# Patient Record
Sex: Male | Born: 1970 | Race: White | Hispanic: No | Marital: Married | Smoking: Never smoker
Health system: Southern US, Community
[De-identification: ages and names within clinical notes are randomized; demographics above are authoritative.]

## PROBLEM LIST (undated history)

## (undated) DIAGNOSIS — E785 Hyperlipidemia, unspecified: Secondary | ICD-10-CM

## (undated) DIAGNOSIS — Z0000 Encounter for general adult medical examination without abnormal findings: Principal | ICD-10-CM

## (undated) DIAGNOSIS — K8020 Calculus of gallbladder without cholecystitis without obstruction: Secondary | ICD-10-CM

## (undated) DIAGNOSIS — N200 Calculus of kidney: Secondary | ICD-10-CM

## (undated) DIAGNOSIS — T7840XA Allergy, unspecified, initial encounter: Secondary | ICD-10-CM

## (undated) DIAGNOSIS — K219 Gastro-esophageal reflux disease without esophagitis: Secondary | ICD-10-CM

## (undated) DIAGNOSIS — E669 Obesity, unspecified: Secondary | ICD-10-CM

## (undated) DIAGNOSIS — M109 Gout, unspecified: Secondary | ICD-10-CM

## (undated) DIAGNOSIS — K429 Umbilical hernia without obstruction or gangrene: Secondary | ICD-10-CM

## (undated) HISTORY — DX: Allergy, unspecified, initial encounter: T78.40XA

## (undated) HISTORY — DX: Hyperlipidemia, unspecified: E78.5

## (undated) HISTORY — DX: Calculus of gallbladder without cholecystitis without obstruction: K80.20

## (undated) HISTORY — DX: Gout, unspecified: M10.9

## (undated) HISTORY — DX: Gastro-esophageal reflux disease without esophagitis: K21.9

## (undated) HISTORY — DX: Umbilical hernia without obstruction or gangrene: K42.9

## (undated) HISTORY — PX: WISDOM TOOTH EXTRACTION: SHX21

## (undated) HISTORY — DX: Obesity, unspecified: E66.9

## (undated) HISTORY — DX: Calculus of kidney: N20.0

## (undated) HISTORY — DX: Encounter for general adult medical examination without abnormal findings: Z00.00

---
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
## 2012-02-01 ENCOUNTER — Ambulatory Visit (INDEPENDENT_AMBULATORY_CARE_PROVIDER_SITE_OTHER): Payer: Managed Care, Other (non HMO) | Admitting: Internal Medicine

## 2012-02-01 ENCOUNTER — Encounter: Payer: Self-pay | Admitting: Internal Medicine

## 2012-02-01 VITALS — BP 88/70 | HR 66 | Temp 98.4°F | Resp 16 | Ht 70.25 in | Wt 216.2 lb

## 2012-02-01 DIAGNOSIS — T7840XA Allergy, unspecified, initial encounter: Secondary | ICD-10-CM

## 2012-02-01 DIAGNOSIS — J310 Chronic rhinitis: Secondary | ICD-10-CM

## 2012-02-01 DIAGNOSIS — M109 Gout, unspecified: Secondary | ICD-10-CM | POA: Insufficient documentation

## 2012-02-01 DIAGNOSIS — N529 Male erectile dysfunction, unspecified: Secondary | ICD-10-CM

## 2012-02-01 HISTORY — DX: Allergy, unspecified, initial encounter: T78.40XA

## 2012-02-01 MED ORDER — VARDENAFIL HCL 20 MG PO TABS: 20.0000 mg | ORAL_TABLET | Freq: Every day | ORAL | Status: DC | PRN

## 2012-02-01 MED ORDER — INDOMETHACIN 50 MG PO CAPS: 50.0000 mg | ORAL_CAPSULE | Freq: Three times a day (TID) | ORAL | Status: DC | PRN

## 2012-02-01 NOTE — Assessment & Plan Note (Signed)
Stable. rf levitra prn

## 2012-02-01 NOTE — Patient Instructions (Signed)
Please schedule fasting labs prior to next visit Cbc, chem7, lipid, lft, tsh, ua with reflex micro and uric acid v70.0

## 2012-02-01 NOTE — Assessment & Plan Note (Signed)
Rf indocin for prn use. Obtain chem7 and uric acid level.

## 2012-02-01 NOTE — Progress Notes (Signed)
  Subjective:    Patient ID: Michael Howe, male    DOB: 1970-08-24, 41 y.o.   MRN: [id number]  HPI Pt presents to clinic to establish care. Notes recent dx of gout evaluated at [facility]. Location left distal foot and resolved quickly with indocin. No gi adverse effect. Had second episode a few weeks ago and resolved similarly. Feels can address food triggers. Apparently uric acid was elevated ?8 at time of first attack. Takes levitra 1/4 tablet prn with good response for ED. Recalls nl testosterone level. No other complaints.  Past Medical History  Diagnosis Date  . Gout    Past Surgical History  Procedure Date  . Tonsillectomy and adenoidectomy 1975    reports that he has never smoked. He does not have any smokeless tobacco history on file. His alcohol and drug histories not on file. family history includes Gout in his maternal grandmother. No Known Allergies   Review of Systems  Respiratory: Negative for shortness of breath.   Cardiovascular: Negative for chest pain.  Musculoskeletal: Negative for joint swelling and arthralgias.  All other systems reviewed and are negative.       Objective:   Physical Exam  Nursing note and vitals reviewed. Constitutional: He appears well-developed and well-nourished. No distress.  HENT:  Head: Normocephalic and atraumatic.  Right Ear: External ear normal.  Left Ear: External ear normal.  Mouth/Throat: Oropharynx is clear and moist. No oropharyngeal exudate.  Eyes: Conjunctivae are normal. Pupils are equal, round, and reactive to light. No scleral icterus.  Neck: Neck supple.  Cardiovascular: Normal rate and normal heart sounds.  Exam reveals no gallop and no friction rub.   No murmur heard. Pulmonary/Chest: Effort normal and breath sounds normal. No respiratory distress. He has no wheezes. He has no rales.  Lymphadenopathy:    He has no cervical adenopathy.  Neurological: He is alert.  Skin: Skin is warm and dry. He is not diaphoretic.    Psychiatric: He has a normal mood and affect.          Assessment & Plan:

## 2012-02-02 LAB — BASIC METABOLIC PANEL
CO2: 28 mEq/L (ref 19–32)
Glucose, Bld: 90 mg/dL (ref 70–99)
Potassium: 4.3 mEq/L (ref 3.5–5.3)
Sodium: 135 mEq/L (ref 135–145)

## 2012-02-17 ENCOUNTER — Telehealth: Payer: Self-pay | Admitting: Internal Medicine

## 2012-02-17 NOTE — Telephone Encounter (Signed)
Received medical records from Dr. Loyal Jacobson  P: [phone] F: [phone]

## 2012-03-02 ENCOUNTER — Telehealth: Payer: Self-pay | Admitting: *Deleted

## 2012-03-02 MED ORDER — ALLOPURINOL 300 MG PO TABS: 300.0000 mg | ORAL_TABLET | Freq: Every day | ORAL | Status: DC

## 2012-03-02 NOTE — Telephone Encounter (Signed)
Message copied by Regis Bill on Wed Mar 02, 2012  4:49 PM ------      Message from: Edwyna Perfect      Created: Thu Feb 11, 2012  7:21 PM       chem7 nl. Uric acid (gout) is definitely elevated. Elevation means is at risk for further attacks. Need to re-offer daily allopurinol to decrease level. Discussed in clinic and didn't seem interested at the time.

## 2012-03-02 NOTE — Telephone Encounter (Signed)
Patient informed; understood & agreed to take medication if he felt a gout attack & continue for preventative measures; at this time feels that his diet & exercise is thwarting gout issue, Rx to pharmacy/[facility]

## 2012-07-14 ENCOUNTER — Encounter: Payer: Self-pay | Admitting: Family Medicine

## 2012-07-14 ENCOUNTER — Ambulatory Visit (INDEPENDENT_AMBULATORY_CARE_PROVIDER_SITE_OTHER): Payer: Managed Care, Other (non HMO) | Admitting: Family Medicine

## 2012-07-14 VITALS — BP 108/72 | HR 92 | Temp 98.1°F | Ht 70.25 in | Wt 233.1 lb

## 2012-07-14 DIAGNOSIS — T7840XA Allergy, unspecified, initial encounter: Secondary | ICD-10-CM

## 2012-07-14 DIAGNOSIS — Z0000 Encounter for general adult medical examination without abnormal findings: Secondary | ICD-10-CM

## 2012-07-14 DIAGNOSIS — N529 Male erectile dysfunction, unspecified: Secondary | ICD-10-CM

## 2012-07-14 DIAGNOSIS — M109 Gout, unspecified: Secondary | ICD-10-CM

## 2012-07-14 HISTORY — DX: Encounter for general adult medical examination without abnormal findings: Z00.00

## 2012-07-14 NOTE — Assessment & Plan Note (Signed)
Levitra prn 

## 2012-07-14 NOTE — Assessment & Plan Note (Signed)
Needs a set of fasting labs. Encouraged DASH diet. Maintain exercise routinely.

## 2012-07-14 NOTE — Assessment & Plan Note (Signed)
No recent flares, maintain adequate hydration and probiotics

## 2012-07-14 NOTE — Patient Instructions (Addendum)
Labs include lipid, renal, cbc, tsh, hepatic, uric acid prior to next years annual   Preventive Care for Adults, Male A healthy lifestyle and preventive care can promote health and wellness. Preventive health guidelines for men include the following key practices:  A routine yearly physical is a good way to check with your caregiver about your health and preventative screening. It is a chance to share any concerns and updates on your health, and to receive a thorough exam.  Visit your dentist for a routine exam and preventative care every 6 months. Brush your teeth twice a day and floss once a day. Good oral hygiene prevents tooth decay and gum disease.  The frequency of eye exams is based on your age, health, family medical history, use of contact lenses, and other factors. Follow your caregiver's recommendations for frequency of eye exams.  Eat a healthy diet. Foods like vegetables, fruits, whole grains, low-fat dairy products, and lean protein foods contain the nutrients you need without too many calories. Decrease your intake of foods high in solid fats, added sugars, and salt. Eat the right amount of calories for you.Get information about a proper diet from your caregiver, if necessary.  Regular physical exercise is one of the most important things you can do for your health. Most adults should get at least 150 minutes of moderate-intensity exercise (any activity that increases your heart rate and causes you to sweat) each week. In addition, most adults need muscle-strengthening exercises on 2 or more days a week.  Maintain a healthy weight. The body mass index (BMI) is a screening tool to identify possible weight problems. It provides an estimate of body fat based on height and weight. Your caregiver can help determine your BMI, and can help you achieve or maintain a healthy weight.For adults 20 years and older:  A BMI below 18.5 is considered underweight.  A BMI of 18.5 to 24.9 is  normal.  A BMI of 25 to 29.9 is considered overweight.  A BMI of 30 and above is considered obese.  Maintain normal blood lipids and cholesterol levels by exercising and minimizing your intake of saturated fat. Eat a balanced diet with plenty of fruit and vegetables. Blood tests for lipids and cholesterol should begin at age 58 and be repeated every 5 years. If your lipid or cholesterol levels are high, you are over 50, or you are a high risk for heart disease, you may need your cholesterol levels checked more frequently.Ongoing high lipid and cholesterol levels should be treated with medicines if diet and exercise are not effective.  If you smoke, find out from your caregiver how to quit. If you do not use tobacco, do not start.  If you choose to drink alcohol, do not exceed 2 drinks per day. One drink is considered to be 12 ounces (355 mL) of beer, 5 ounces (148 mL) of wine, or 1.5 ounces (44 mL) of liquor.  Avoid use of street drugs. Do not share needles with anyone. Ask for help if you need support or instructions about stopping the use of drugs.  High blood pressure causes heart disease and increases the risk of stroke. Your blood pressure should be checked at least every 1 to 2 years. Ongoing high blood pressure should be treated with medicines, if weight loss and exercise are not effective.  If you are 23 to 44 years old, ask your caregiver if you should take aspirin to prevent heart disease.  Diabetes screening involves taking a blood  sample to check your fasting blood sugar level. This should be done once every 3 years, after age 41, if you are within normal weight and without risk factors for diabetes. Testing should be considered at a younger age or be carried out more frequently if you are overweight and have at least 1 risk factor for diabetes.  Colorectal cancer can be detected and often prevented. Most routine colorectal cancer screening begins at the age of 95 and continues  through age 31. However, your caregiver may recommend screening at an earlier age if you have risk factors for colon cancer. On a yearly basis, your caregiver may provide home test kits to check for hidden blood in the stool. Use of a small camera at the end of a tube, to directly examine the colon (sigmoidoscopy or colonoscopy), can detect the earliest forms of colorectal cancer. Talk to your caregiver about this at age 72, when routine screening begins. Direct examination of the colon should be repeated every 5 to 10 years through age 42, unless early forms of pre-cancerous polyps or small growths are found.  Hepatitis C blood testing is recommended for all people born from [birth year] through 1965 and any individual with known risks for hepatitis C.  Practice safe sex. Use condoms and avoid high-risk sexual practices to reduce the spread of sexually transmitted infections (STIs). STIs include gonorrhea, chlamydia, syphilis, trichomonas, herpes, HPV, and human immunodeficiency virus (HIV). Herpes, HIV, and HPV are viral illnesses that have no cure. They can result in disability, cancer, and death.  A one-time screening for abdominal aortic aneurysm (AAA) and surgical repair of large AAAs by sound wave imaging (ultrasonography) is recommended for ages 24 to 36 years who are current or former smokers.  Healthy men should no longer receive prostate-specific antigen (PSA) blood tests as part of routine cancer screening. Consult with your caregiver about prostate cancer screening.  Testicular cancer screening is not recommended for adult males who have no symptoms. Screening includes self-exam, caregiver exam, and other screening tests. Consult with your caregiver about any symptoms you have or any concerns you have about testicular cancer.  Use sunscreen with skin protection factor (SPF) of 30 or more. Apply sunscreen liberally and repeatedly throughout the day. You should seek shade when your shadow is shorter  than you. Protect yourself by wearing long sleeves, pants, a wide-brimmed hat, and sunglasses year round, whenever you are outdoors.  Once a month, do a whole body skin exam, using a mirror to look at the skin on your back. Notify your caregiver of new moles, moles that have irregular borders, moles that are larger than a pencil eraser, or moles that have changed in shape or color.  Stay current with required immunizations.  Influenza. You need a dose every fall (or winter). The composition of the flu vaccine changes each year, so being vaccinated once is not enough.  Pneumococcal polysaccharide. You need 1 to 2 doses if you smoke cigarettes or if you have certain chronic medical conditions. You need 1 dose at age 35 (or older) if you have never been vaccinated.  Tetanus, diphtheria, pertussis (Tdap, Td). Get 1 dose of Tdap vaccine if you are younger than age 47 years, are over 81 and have contact with an infant, are a Research scientist (physical sciences), or simply want to be protected from whooping cough. After that, you need a Td booster dose every 10 years. Consult your caregiver if you have not had at least 3 tetanus and diphtheria-containing shots sometime  in your life or have a deep or dirty wound.  HPV. This vaccine is recommended for males 13 through 44 years of age. This vaccine may be given to men 22 through 44 years of age who have not completed the 3 dose series. It is recommended for men through age 17 who have sex with men or whose immune system is weakened because of HIV infection, other illness, or medications. The vaccine is given in 3 doses over 6 months.  Measles, mumps, rubella (MMR). You need at least 1 dose of MMR if you were born in 1957 or later. You may also need a 2nd dose.  Meningococcal. If you are age 62 to 41 years and a Orthoptist living in a residence hall, or have one of several medical conditions, you need to get vaccinated against meningococcal disease. You may also  need additional booster doses.  Zoster (shingles). If you are age 63 years or older, you should get this vaccine.  Varicella (chickenpox). If you have never had chickenpox or you were vaccinated but received only 1 dose, talk to your caregiver to find out if you need this vaccine.  Hepatitis A. You need this vaccine if you have a specific risk factor for hepatitis A virus infection, or you simply wish to be protected from this disease. The vaccine is usually given as 2 doses, 6 to 18 months apart.  Hepatitis B. You need this vaccine if you have a specific risk factor for hepatitis B virus infection or you simply wish to be protected from this disease. The vaccine is given in 3 doses, usually over 6 months. Preventative Service / Frequency Ages 49 to 75  Blood pressure check.** / Every 1 to 2 years.  Lipid and cholesterol check.** / Every 5 years beginning at age 6.  Hepatitis C blood test.** / For any individual with known risks for hepatitis C.  Skin self-exam. / Monthly.  Influenza immunization.** / Every year.  Pneumococcal polysaccharide immunization.** / 1 to 2 doses if you smoke cigarettes or if you have certain chronic medical conditions.  Tetanus, diphtheria, pertussis (Tdap,Td) immunization. / A one-time dose of Tdap vaccine. After that, you need a Td booster dose every 10 years.  HPV immunization. / 3 doses over 6 months, if 26 and younger.  Measles, mumps, rubella (MMR) immunization. / You need at least 1 dose of MMR if you were born in 1957 or later. You may also need a 2nd dose.  Meningococcal immunization. / 1 dose if you are age 24 to 69 years and a Orthoptist living in a residence hall, or have one of several medical conditions, you need to get vaccinated against meningococcal disease. You may also need additional booster doses.  Varicella immunization.** / Consult your caregiver.  Hepatitis A immunization.** / Consult your caregiver. 2 doses, 6 to  18 months apart.  Hepatitis B immunization.** / Consult your caregiver. 3 doses usually over 6 months. Ages 74 to 76  Blood pressure check.** / Every 1 to 2 years.  Lipid and cholesterol check.** / Every 5 years beginning at age 80.  Fecal occult blood test (FOBT) of stool. / Every year beginning at age 22 and continuing until age 55. You may not have to do this test if you get colonoscopy every 10 years.  Flexible sigmoidoscopy** or colonoscopy.** / Every 5 years for a flexible sigmoidoscopy or every 10 years for a colonoscopy beginning at age 56 and continuing until age 44.  Hepatitis C blood test.** / For all people born from [birth year] through 1965 and any individual with known risks for hepatitis C.  Skin self-exam. / Monthly.  Influenza immunization.** / Every year.  Pneumococcal polysaccharide immunization.** / 1 to 2 doses if you smoke cigarettes or if you have certain chronic medical conditions.  Tetanus, diphtheria, pertussis (Tdap/Td) immunization.** / A one-time dose of Tdap vaccine. After that, you need a Td booster dose every 10 years.  Measles, mumps, rubella (MMR) immunization. / You need at least 1 dose of MMR if you were born in 1957 or later. You may also need a 2nd dose.  Varicella immunization.**/ Consult your caregiver.  Meningococcal immunization.** / Consult your caregiver.  Hepatitis A immunization.** / Consult your caregiver. 2 doses, 6 to 18 months apart.  Hepatitis B immunization.** / Consult your caregiver. 3 doses, usually over 6 months. Ages 81 and over  Blood pressure check.** / Every 1 to 2 years.  Lipid and cholesterol check.**/ Every 5 years beginning at age 35.  Fecal occult blood test (FOBT) of stool. / Every year beginning at age 22 and continuing until age 33. You may not have to do this test if you get colonoscopy every 10 years.  Flexible sigmoidoscopy** or colonoscopy.** / Every 5 years for a flexible sigmoidoscopy or every 10 years for a  colonoscopy beginning at age 60 and continuing until age 6.  Hepatitis C blood test.** / For all people born from 80 through 1965 and any individual with known risks for hepatitis C.  Abdominal aortic aneurysm (AAA) screening.** / A one-time screening for ages 70 to 13 years who are current or former smokers.  Skin self-exam. / Monthly.  Influenza immunization.** / Every year.  Pneumococcal polysaccharide immunization.** / 1 dose at age 83 (or older) if you have never been vaccinated.  Tetanus, diphtheria, pertussis (Tdap, Td) immunization. / A one-time dose of Tdap vaccine if you are over 65 and have contact with an infant, are a Research scientist (physical sciences), or simply want to be protected from whooping cough. After that, you need a Td booster dose every 10 years.  Varicella immunization. ** / Consult your caregiver.  Meningococcal immunization.** / Consult your caregiver.  Hepatitis A immunization. ** / Consult your caregiver. 2 doses, 6 to 18 months apart.  Hepatitis B immunization.** / Check with your caregiver. 3 doses, usually over 6 months. **Family history and personal history of risk and conditions may change your caregiver's recommendations. Document Released: 07/21/2001 Document Revised: 08/17/2011 Document Reviewed: 10/20/2010 [facility] Patient Information 2013 [place], [place].

## 2012-07-14 NOTE — Assessment & Plan Note (Signed)
Doing well with Flonase and Claritin

## 2012-07-17 NOTE — Progress Notes (Signed)
Patient ID: Treysen Sudbeck, male   DOB: 08/25/1970, 42 y.o.   MRN: [id number] Ladarion Munyon [id number] [birth year] 07/17/2012      Progress Note New Patient  Subjective  Chief Complaint  Chief Complaint  Patient presents with  . Annual Exam    physical    HPI  Patient is a 42 year old Caucasian male who is in today for annual exam. Feeling very well. Has essentially given up service and notes no gouty outbreaks since that time. He is eating [place] regularly and having no GI upset. No recent illness. No fevers or chills. No chest pain, palpitations, shortness of breath, GU concerns today. Is exercising several days a week and trying to maintain a heart healthy diet.  Past Medical History  Diagnosis Date  . Gout   . Preventative health care 07/14/2012  . Allergic state 02/01/2012    Past Surgical History  Procedure Laterality Date  . Tonsillectomy and adenoidectomy  1975    Family History  Problem Relation Age of Onset  . Gout Maternal Grandmother   . Hyperlipidemia Mother   . Diabetes Mother   . Bowel Disease Mother     IBS  . Heart disease Sister   . Autism Son   . Stroke Paternal Grandmother     History   Social History  . Marital Status: Married    Spouse Name: N/A    Number of Children: N/A  . Years of Education: N/A   Occupational History  . Not on file.   Social History Main Topics  . Smoking status: Never Smoker   . Smokeless tobacco: Not on file  . Alcohol Use: Not on file  . Drug Use: Not on file  . Sexually Active: Not on file   Other Topics Concern  . Not on file   Social History Narrative  . No narrative on file    Current Outpatient Prescriptions on File Prior to Visit  Medication Sig Dispense Refill  . allopurinol (ZYLOPRIM) 300 MG tablet Take 1 tablet (300 mg total) by mouth daily.  30 tablet  6  . loratadine (CLARITIN) 10 MG tablet Take 10 mg by mouth daily.      . mometasone (NASONEX) 50 MCG/ACT nasal spray Place 2 sprays into the nose  daily.       No current facility-administered medications on file prior to visit.    No Known Allergies  Review of Systems  Review of Systems  Constitutional: Negative for fever, chills and malaise/fatigue.  HENT: Negative for hearing loss, nosebleeds and congestion.   Eyes: Negative for discharge.  Respiratory: Negative for cough, sputum production, shortness of breath and wheezing.   Cardiovascular: Negative for chest pain, palpitations and leg swelling.  Gastrointestinal: Negative for heartburn, nausea, vomiting, abdominal pain, diarrhea, constipation and blood in stool.  Genitourinary: Negative for dysuria, urgency, frequency and hematuria.  Musculoskeletal: Negative for myalgias, back pain and falls.  Skin: Negative for rash.  Neurological: Negative for dizziness, tremors, sensory change, focal weakness, loss of consciousness, weakness and headaches.  Endo/Heme/Allergies: Negative for polydipsia. Does not bruise/bleed easily.  Psychiatric/Behavioral: Negative for depression and suicidal ideas. The patient is not nervous/anxious and does not have insomnia.     Objective  BP 108/72  Pulse 92  Temp(Src) 98.1 F (36.7 C) (Oral)  Ht 5' 10.25" (1.784 m)  Wt 233 lb 1.9 oz (105.743 kg)  BMI 33.22 kg/m2  SpO2 98%  Physical Exam  Physical Exam  Constitutional: He is oriented to person, place, and  time and well-developed, well-nourished, and in no distress. No distress.  HENT:  Head: Normocephalic and atraumatic.  Eyes: Conjunctivae are normal.  Neck: Neck supple. No thyromegaly present.  Cardiovascular: Normal rate, regular rhythm and normal heart sounds.   No murmur heard. Pulmonary/Chest: Effort normal and breath sounds normal. No respiratory distress.  Abdominal: He exhibits no distension and no mass. There is no tenderness.  Musculoskeletal: He exhibits no edema.  Neurological: He is alert and oriented to person, place, and time.  Skin: Skin is warm.  Psychiatric:  Memory, affect and judgment normal.       Assessment & Plan  Allergic state Doing well with Flonase and Claritin  ED (erectile dysfunction) Levitra prn  Gout No recent flares, maintain adequate hydration and probiotics  Preventative health care Needs a set of fasting labs. Encouraged DASH diet. Maintain exercise routinely.

## 2012-07-18 ENCOUNTER — Telehealth: Payer: Self-pay

## 2012-07-18 ENCOUNTER — Encounter: Payer: Self-pay | Admitting: Family Medicine

## 2012-07-18 ENCOUNTER — Other Ambulatory Visit: Payer: Self-pay | Admitting: Family Medicine

## 2012-07-18 DIAGNOSIS — Z0000 Encounter for general adult medical examination without abnormal findings: Secondary | ICD-10-CM

## 2012-07-18 DIAGNOSIS — M109 Gout, unspecified: Secondary | ICD-10-CM

## 2012-07-18 NOTE — Telephone Encounter (Signed)
Patient came in today for labs 

## 2012-07-19 LAB — TSH: TSH: 1.406 u[IU]/mL (ref 0.350–4.500)

## 2012-07-19 LAB — CBC
HCT: 45 % (ref 39.0–52.0)
Platelets: 268 10*3/uL (ref 150–400)
RDW: 14.7 % (ref 11.5–15.5)
WBC: 4.6 10*3/uL (ref 4.0–10.5)

## 2012-07-19 LAB — BASIC METABOLIC PANEL
CO2: 27 mEq/L (ref 19–32)
Glucose, Bld: 96 mg/dL (ref 70–99)
Potassium: 4.1 mEq/L (ref 3.5–5.3)
Sodium: 137 mEq/L (ref 135–145)

## 2012-07-19 LAB — HEPATIC FUNCTION PANEL
Bilirubin, Direct: 0.1 mg/dL (ref 0.0–0.3)
Total Bilirubin: 0.6 mg/dL (ref 0.3–1.2)

## 2012-07-19 NOTE — Telephone Encounter (Signed)
Quick Note:  Patient Informed and voiced understanding ______ 

## 2012-07-28 ENCOUNTER — Encounter: Payer: Managed Care, Other (non HMO) | Admitting: Family Medicine

## 2012-12-23 ENCOUNTER — Ambulatory Visit (INDEPENDENT_AMBULATORY_CARE_PROVIDER_SITE_OTHER): Payer: Managed Care, Other (non HMO) | Admitting: Emergency Medicine

## 2012-12-23 VITALS — BP 110/82 | HR 67 | Temp 98.0°F | Resp 18 | Ht 72.0 in | Wt 218.0 lb

## 2012-12-23 DIAGNOSIS — L918 Other hypertrophic disorders of the skin: Secondary | ICD-10-CM

## 2012-12-23 DIAGNOSIS — L909 Atrophic disorder of skin, unspecified: Secondary | ICD-10-CM

## 2012-12-23 DIAGNOSIS — L919 Hypertrophic disorder of the skin, unspecified: Secondary | ICD-10-CM

## 2012-12-23 NOTE — Progress Notes (Signed)
Procedure- Skin tag removal. Consent obtained from pt. Area prepped with alcohol. Skin tags <36mm cut off without local anesthetic. Larger skin tag removed with local anesthesia. Lidocaine + epi 2%. Silver nitrate used for local hemostasis. Band-aid placed over larger skin tag. Wound care discussed.

## 2012-12-23 NOTE — Progress Notes (Signed)
I was directly involved in the patient's care and actively participated during the procedure.  

## 2012-12-23 NOTE — Progress Notes (Signed)
[facility] [address], [place] [address] [phone]  Date:  12/23/2012   Name:  Michael Howe   DOB:  08/28/1970   MRN:  [id number]  PCP:  No PCP Per Patient    Chief Complaint: skin tags very sore   History of Present Illness:  Michael Howe is a 42 y.o. very pleasant male patient who presents with the following:  Skin tags inner thigh that give him significant pain with running.  No improvement with over the counter medications or other home remedies. Denies other complaint or health concern today.   Patient Active Problem List   Diagnosis Date Noted  . Preventative health care 07/14/2012  . Gout 02/01/2012  . Allergic state 02/01/2012  . ED (erectile dysfunction) 02/01/2012    Past Medical History  Diagnosis Date  . Gout   . Preventative health care 07/14/2012  . Allergic state 02/01/2012  . Allergy     Past Surgical History  Procedure Laterality Date  . Tonsillectomy and adenoidectomy  1975    History  Substance Use Topics  . Smoking status: Never Smoker   . Smokeless tobacco: Not on file  . Alcohol Use: Not on file    Family History  Problem Relation Age of Onset  . Gout Maternal Grandmother   . Hyperlipidemia Mother   . Diabetes Mother   . Bowel Disease Mother     IBS  . Heart disease Sister   . Autism Son   . Stroke Paternal Grandmother     No Known Allergies  Medication list has been reviewed and updated.  Current Outpatient Prescriptions on File Prior to Visit  Medication Sig Dispense Refill  . allopurinol (ZYLOPRIM) 300 MG tablet Take 1 tablet (300 mg total) by mouth daily.  30 tablet  6  . loratadine (CLARITIN) 10 MG tablet Take 10 mg by mouth daily.      . vardenafil (LEVITRA) 20 MG tablet Take 5 mg by mouth daily as needed.      . fluticasone (FLONASE) 50 MCG/ACT nasal spray       . mometasone (NASONEX) 50 MCG/ACT nasal spray Place 2 sprays into the nose daily.      . NON FORMULARY Life way Kefir- drink 8 oz  daily       No current facility-administered medications on file prior to visit.    Review of Systems:  As per HPI, otherwise negative.    Physical Examination: Filed Vitals:   12/23/12 1135  BP: 110/82  Pulse: 67  Temp: 98 F (36.7 C)  Resp: 18   Filed Vitals:   12/23/12 1135  Height: 6' (1.829 m)  Weight: 218 lb (98.884 kg)   Body mass index is 29.56 kg/(m^2). Ideal Body Weight: Weight in (lb) to have BMI = 25: 183.9   GEN: WDWN, NAD, Non-toxic, Alert & Oriented x 3 HEENT: Atraumatic, Normocephalic.  Ears and Nose: No external deformity. EXTR: No clubbing/cyanosis/edema NEURO: Normal gait.  PSYCH: Normally interactive. Conversant. Not depressed or anxious appearing.  Calm demeanor.  Skin:  Three skin tags right medial thigh  Assessment and Plan: Skin tags  Signed,  Phillips Odor, MD

## 2013-02-10 ENCOUNTER — Ambulatory Visit: Payer: Managed Care, Other (non HMO) | Admitting: Family Medicine

## 2013-02-13 ENCOUNTER — Telehealth: Payer: Self-pay

## 2013-02-13 MED ORDER — ALLOPURINOL 300 MG PO TABS: 300.0000 mg | ORAL_TABLET | Freq: Every day | ORAL | Status: DC

## 2013-02-13 MED ORDER — VARDENAFIL HCL 20 MG PO TABS: 5.0000 mg | ORAL_TABLET | Freq: Every day | ORAL | Status: DC | PRN

## 2013-02-13 NOTE — Telephone Encounter (Signed)
Idomethacin 50 mg 1 tab po tid # 30 3rf

## 2013-02-13 NOTE — Telephone Encounter (Signed)
Patient states he takes indomethacin 50 mg tid prn? It looks like the last RX was done in 8-13? Please advise refilling this

## 2013-02-13 NOTE — Telephone Encounter (Signed)
Patient called and left a message stating that he needs his RX's refilled and to call him.  I left a message for patient to return my call

## 2013-02-14 MED ORDER — INDOMETHACIN 50 MG PO CAPS: 50.0000 mg | ORAL_CAPSULE | Freq: Three times a day (TID) | ORAL | Status: AC | PRN

## 2013-02-21 ENCOUNTER — Telehealth: Payer: Self-pay

## 2013-02-21 ENCOUNTER — Other Ambulatory Visit: Payer: Self-pay | Admitting: *Deleted

## 2013-02-21 NOTE — Telephone Encounter (Signed)
Why was it changed, is patient aware and he is OK with change, if so I am fine with this. Levitra 2.5 mg tab 1 tab po daily prn ED, disp #8 with 3 rf

## 2013-02-21 NOTE — Telephone Encounter (Signed)
Prior Authorization fax request received from pharmacy for Levitra 2.5 mg tab 09.08.14, requested PA via Cover My Meds 09.09.14 electronically/SLS Approval received & faxed to pharmacy 09.16.14/SLS

## 2013-02-21 NOTE — Telephone Encounter (Signed)
I'm not sure?  Jasmine December worked on this? Jasmine December can you advise?

## 2013-02-21 NOTE — Telephone Encounter (Signed)
Levitra was approved for 2.5 mg quantity 8 tablets per 30 days for 1 yr (02-21-13 through 02-20-14)  Please advise changing the Levitra RX.

## 2013-02-24 ENCOUNTER — Telehealth: Payer: Self-pay

## 2013-02-24 NOTE — Telephone Encounter (Signed)
Opened in error

## 2013-02-24 NOTE — Telephone Encounter (Signed)
Jasmine December do you know why the RX was for Levitra 20 mg but the PA was done on Levitra 2.5 mg? Dr Abner Greenspan needs to know if patient is aware of this change.  Karin Golden needs new RX if so?

## 2013-02-28 NOTE — Telephone Encounter (Signed)
Unclear as to why Cover My Meds request inserted 2.5 mg as dosage; sent new corrected request for Levitra 20 mg/SLS

## 2013-06-10 ENCOUNTER — Ambulatory Visit: Payer: Managed Care, Other (non HMO)

## 2013-06-10 ENCOUNTER — Ambulatory Visit (INDEPENDENT_AMBULATORY_CARE_PROVIDER_SITE_OTHER): Payer: Managed Care, Other (non HMO) | Admitting: Internal Medicine

## 2013-06-10 VITALS — BP 110/70 | HR 85 | Temp 98.9°F | Resp 16 | Ht 71.0 in | Wt 221.0 lb

## 2013-06-10 DIAGNOSIS — R05 Cough: Secondary | ICD-10-CM

## 2013-06-10 DIAGNOSIS — R059 Cough, unspecified: Secondary | ICD-10-CM

## 2013-06-10 DIAGNOSIS — J189 Pneumonia, unspecified organism: Secondary | ICD-10-CM

## 2013-06-10 DIAGNOSIS — R509 Fever, unspecified: Secondary | ICD-10-CM

## 2013-06-10 LAB — POCT CBC
Granulocyte percent: 67.9 %G (ref 37–80)
HEMATOCRIT: 50.5 % (ref 43.5–53.7)
HEMOGLOBIN: 16.2 g/dL (ref 14.1–18.1)
LYMPH, POC: 1.6 (ref 0.6–3.4)
MCH: 29.5 pg (ref 27–31.2)
MCHC: 32.1 g/dL (ref 31.8–35.4)
MCV: 92 fL (ref 80–97)
MID (cbc): 0.7 (ref 0–0.9)
MPV: 8.5 fL (ref 0–99.8)
POC Granulocyte: 5 (ref 2–6.9)
POC LYMPH PERCENT: 22.4 %L (ref 10–50)
POC MID %: 9.7 % (ref 0–12)
Platelet Count, POC: 261 10*3/uL (ref 142–424)
RBC: 5.49 M/uL (ref 4.69–6.13)
RDW, POC: 14.4 %
WBC: 7.3 10*3/uL (ref 4.6–10.2)

## 2013-06-10 MED ORDER — LEVOFLOXACIN 750 MG PO TABS: 750.0000 mg | ORAL_TABLET | Freq: Every day | ORAL | Status: DC

## 2013-06-10 MED ORDER — HYDROCODONE-HOMATROPINE 5-1.5 MG/5ML PO SYRP: 5.0000 mL | ORAL_SOLUTION | Freq: Four times a day (QID) | ORAL | Status: DC | PRN

## 2013-06-10 NOTE — Progress Notes (Addendum)
Subjective:    Patient ID: Michael Howe, male    DOB: 1971/01/19, 43 y.o.   MRN: [id number]  HPI This chart was scribed for Tami Lin, MD by Thea Alken, Scribe. This patient was seen in room 13 and the patient's care was started at 3:23 PM.  HPI Comments: Michael Howe is a 43 y.o. male who presents to the [facility] complaining of constant, worsening cough with associated HA, fever, and body aches, over the past 3 days. He reports feeling fatigue. He denies rhinorrhea. Pt has sick contacts and states that his son was just seen at [facility] for pneumonia-he was on the ventilator for a few days and had 3 different antibiotics before he responded/fever to 106.   Past Medical History  Diagnosis Date  . Gout   . Preventative health care 07/14/2012  . Allergic state 02/01/2012  . Allergy    Past Surgical History  Procedure Laterality Date  . Tonsillectomy and adenoidectomy  1975   Family History  Problem Relation Age of Onset  . Gout Maternal Grandmother   . Hyperlipidemia Mother   . Diabetes Mother   . Bowel Disease Mother     IBS  . Heart disease Sister   . Autism Son   . Stroke Paternal Grandmother    History   Social History  . Marital Status: Married    Spouse Name: N/A    Number of Children: N/A  . Years of Education: N/A   Occupational History  . Not on file.   Social History Main Topics  . Smoking status: Never Smoker   . Smokeless tobacco: Not on file  . Alcohol Use: Not on file  . Drug Use: Not on file  . Sexual Activity: Not on file   Other Topics Concern  . Not on file   Social History Narrative  . No narrative on file   Review of Systems No weight loss  no night sweats  no sore throat No nausea vomiting  No chest pain or shortness of breath   No skin ras    Objective:   Physical Exam  Filed Vitals:   06/10/13 1453  BP: 110/70  Pulse: 85  Temp: 98.9 F (37.2 C)  Resp: 16  No acute distress Conjunctiva  clear TMs and nares clear Throat clear No nodes Chest with dullness at the left base but no rales rhonchi or wheezing Heart regular without murmur   Results for orders placed in visit on 06/10/13  POCT CBC      Result Value Range   WBC 7.3  4.6 - 10.2 K/uL   Lymph, poc 1.6  0.6 - 3.4   POC LYMPH PERCENT 22.4  10 - 50 %L   MID (cbc) 0.7  0 - 0.9   POC MID % 9.7  0 - 12 %M   POC Granulocyte 5.0  2 - 6.9   Granulocyte percent 67.9  37 - 80 %G   RBC 5.49  4.69 - 6.13 M/uL   Hemoglobin 16.2  14.1 - 18.1 g/dL   HCT, POC 50.5  43.5 - 53.7 %   MCV 92.0  80 - 97 fL   MCH, POC 29.5  27 - 31.2 pg   MCHC 32.1  31.8 - 35.4 g/dL   RDW, POC 14.4     Platelet Count, POC 261  142 - 424 K/uL   MPV 8.5  0 - 99.8 fL   [facility] reading (PRIMARY) by  Dr. Doolittle= lingular infiltrate      Assessment & Plan:  I have completed the patient encounter in its entirety as documented by the scribe, with editing by me where necessary. Robert P. Laney Pastor, M.D.  Fever, unspecified - Plan: POCT CBC, DG Chest 2 View  Cough - Plan: POCT CBC, DG Chest 2 View  CAP (community acquired pneumonia)  Meds ordered this encounter  Medications  . levofloxacin (LEVAQUIN) 750 MG tablet    Sig: Take 1 tablet (750 mg total) by mouth daily.    Dispense:  10 tablet    Refill:  0  . HYDROcodone-homatropine (HYCODAN) 5-1.5 MG/5ML syrup    Sig: Take 5 mLs by mouth every 6 (six) hours as needed for cough.    Dispense:  120 mL    Refill:  0   close followup Rex-ray 6 weeks

## 2013-07-17 ENCOUNTER — Encounter: Payer: Managed Care, Other (non HMO) | Admitting: Family Medicine

## 2013-07-24 ENCOUNTER — Ambulatory Visit (INDEPENDENT_AMBULATORY_CARE_PROVIDER_SITE_OTHER): Payer: Managed Care, Other (non HMO) | Admitting: Family Medicine

## 2013-07-24 ENCOUNTER — Encounter: Payer: Self-pay | Admitting: Family Medicine

## 2013-07-24 VITALS — BP 104/64 | HR 86 | Temp 98.7°F | Resp 18 | Ht 71.0 in | Wt 225.0 lb

## 2013-07-24 DIAGNOSIS — T7840XA Allergy, unspecified, initial encounter: Secondary | ICD-10-CM

## 2013-07-24 DIAGNOSIS — Z0000 Encounter for general adult medical examination without abnormal findings: Secondary | ICD-10-CM

## 2013-07-24 DIAGNOSIS — M109 Gout, unspecified: Secondary | ICD-10-CM

## 2013-07-24 DIAGNOSIS — E785 Hyperlipidemia, unspecified: Secondary | ICD-10-CM

## 2013-07-24 DIAGNOSIS — N529 Male erectile dysfunction, unspecified: Secondary | ICD-10-CM

## 2013-07-24 LAB — URIC ACID: URIC ACID, SERUM: 7.8 mg/dL (ref 4.0–7.8)

## 2013-07-24 LAB — HEPATIC FUNCTION PANEL
ALK PHOS: 41 U/L (ref 39–117)
ALT: 44 U/L (ref 0–53)
AST: 25 U/L (ref 0–37)
Albumin: 4.7 g/dL (ref 3.5–5.2)
BILIRUBIN DIRECT: 0.1 mg/dL (ref 0.0–0.3)
BILIRUBIN TOTAL: 0.5 mg/dL (ref 0.2–1.2)
Indirect Bilirubin: 0.4 mg/dL (ref 0.2–1.2)
Total Protein: 6.8 g/dL (ref 6.0–8.3)

## 2013-07-24 LAB — CBC
HCT: 46.7 % (ref 39.0–52.0)
Hemoglobin: 16.2 g/dL (ref 13.0–17.0)
MCH: 29.3 pg (ref 26.0–34.0)
MCHC: 34.7 g/dL (ref 30.0–36.0)
MCV: 84.6 fL (ref 78.0–100.0)
Platelets: 308 10*3/uL (ref 150–400)
RBC: 5.52 MIL/uL (ref 4.22–5.81)
RDW: 14.3 % (ref 11.5–15.5)
WBC: 5.2 10*3/uL (ref 4.0–10.5)

## 2013-07-24 LAB — RENAL FUNCTION PANEL
Albumin: 4.7 g/dL (ref 3.5–5.2)
BUN: 12 mg/dL (ref 6–23)
CHLORIDE: 99 meq/L (ref 96–112)
CO2: 26 meq/L (ref 19–32)
CREATININE: 0.9 mg/dL (ref 0.50–1.35)
Calcium: 9.7 mg/dL (ref 8.4–10.5)
Glucose, Bld: 81 mg/dL (ref 70–99)
Phosphorus: 3.1 mg/dL (ref 2.3–4.6)
Potassium: 4.4 mEq/L (ref 3.5–5.3)
SODIUM: 135 meq/L (ref 135–145)

## 2013-07-24 LAB — LIPID PANEL
CHOL/HDL RATIO: 4.3 ratio
Cholesterol: 160 mg/dL (ref 0–200)
HDL: 37 mg/dL — ABNORMAL LOW (ref >39)
LDL CALC: 84 mg/dL (ref 0–99)
Triglycerides: 194 mg/dL — ABNORMAL HIGH (ref <150)
VLDL: 39 mg/dL (ref 0–40)

## 2013-07-24 MED ORDER — INDOMETHACIN 50 MG PO CAPS: 50.0000 mg | ORAL_CAPSULE | Freq: Two times a day (BID) | ORAL | Status: DC | PRN

## 2013-07-24 MED ORDER — FEXOFENADINE HCL 180 MG PO TABS: 180.0000 mg | ORAL_TABLET | Freq: Every day | ORAL | Status: DC | PRN

## 2013-07-24 NOTE — Progress Notes (Signed)
Patient ID: Michael Howe, male   DOB: 1970/08/26, 43 y.o.   MRN: [id number] Michael Howe [id number] Aug 01, 1970 07/24/2013      Progress Note-Follow Up  Subjective  Chief Complaint  Chief Complaint  Patient presents with  . Annual Exam    pt here for cpe, denied flu shot, could not remember his last tetatnus shot.     HPI Patient is a 43 year old Caucasian male who is in today for routine medical care. He's had a very stressful last few months secondary to his teenage son being hospitalized in the NICU with severe pneumonia. He is home now and doing better. The patient himself did have a respiratory illness that responded well to antibiotics. Otherwise she's been healthy. No recent illness. No chest pain, palpitations or shortness of breath. No GI or GU concerns. He is now she's not eating consistently heart healthy diet he has been exercise some. No gou tablet she's responding to Allegra most of the timety flares on current medications.  Past Medical History  Diagnosis Date  . Gout   . Preventative health care 07/14/2012  . Allergic state 02/01/2012  . Allergy     Past Surgical History  Procedure Laterality Date  . Tonsillectomy and adenoidectomy  1975    Family History  Problem Relation Age of Onset  . Gout Maternal Grandmother   . Hyperlipidemia Mother   . Diabetes Mother   . Bowel Disease Mother     IBS  . Heart disease Sister   . Autism Son   . Stroke Paternal Grandmother     History   Social History  . Marital Status: Married    Spouse Name: N/A    Number of Children: N/A  . Years of Education: N/A   Occupational History  . Not on file.   Social History Main Topics  . Smoking status: Never Smoker   . Smokeless tobacco: Not on file  . Alcohol Use: No  . Drug Use: Not on file  . Sexual Activity: Yes   Other Topics Concern  . Not on file   Social History Narrative  . No narrative on file    Current Outpatient Prescriptions on File Prior to Visit   Medication Sig Dispense Refill  . allopurinol (ZYLOPRIM) 300 MG tablet Take 1 tablet (300 mg total) by mouth daily.  30 tablet  3  . fluticasone (FLONASE) 50 MCG/ACT nasal spray       . HYDROcodone-homatropine (HYCODAN) 5-1.5 MG/5ML syrup Take 5 mLs by mouth every 6 (six) hours as needed for cough.  120 mL  0  . levofloxacin (LEVAQUIN) 750 MG tablet Take 1 tablet (750 mg total) by mouth daily.  10 tablet  0  . loratadine (CLARITIN) 10 MG tablet Take 10 mg by mouth daily.      . NON FORMULARY Life way Kefir- drink 8 oz daily      . vardenafil (LEVITRA) 20 MG tablet Take 0.5 tablets (10 mg total) by mouth daily as needed.  10 tablet  3  . mometasone (NASONEX) 50 MCG/ACT nasal spray Place 2 sprays into the nose daily.       No current facility-administered medications on file prior to visit.    No Known Allergies  Review of Systems  Review of Systems  Constitutional: Negative for fever, chills and malaise/fatigue.  HENT: Negative for congestion, hearing loss and nosebleeds.   Eyes: Negative for discharge.  Respiratory: Negative for cough, sputum production, shortness of breath and wheezing.  Cardiovascular: Negative for chest pain, palpitations and leg swelling.  Gastrointestinal: Negative for heartburn, nausea, vomiting, abdominal pain, diarrhea, constipation and blood in stool.  Genitourinary: Negative for dysuria, urgency, frequency and hematuria.  Musculoskeletal: Negative for back pain, falls and myalgias.  Skin: Negative for rash.  Neurological: Negative for dizziness, tremors, sensory change, focal weakness, loss of consciousness, weakness and headaches.  Endo/Heme/Allergies: Negative for polydipsia. Does not bruise/bleed easily.  Psychiatric/Behavioral: Negative for depression and suicidal ideas. The patient is not nervous/anxious and does not have insomnia.     Objective  BP 104/64  Pulse 86  Temp(Src) 98.7 F (37.1 C) (Oral)  Resp 18  Ht 5\' 11"  (1.803 m)  Wt 225 lb  (102.059 kg)  BMI 31.39 kg/m2  SpO2 98%  Physical Exam  Physical Exam  Constitutional: He is oriented to person, place, and time and well-developed, well-nourished, and in no distress. No distress.  HENT:  Head: Normocephalic and atraumatic.  Eyes: Conjunctivae are normal.  Neck: Neck supple. No thyromegaly present.  Cardiovascular: Normal rate, regular rhythm and normal heart sounds.   No murmur heard. Pulmonary/Chest: Effort normal and breath sounds normal. No respiratory distress.  Abdominal: He exhibits no distension and no mass. There is no tenderness.  Musculoskeletal: He exhibits no edema.  Neurological: He is alert and oriented to person, place, and time.  Skin: Skin is warm.  Psychiatric: Memory, affect and judgment normal.    Lab Results  Component Value Date   TSH 1.406 07/18/2012   Lab Results  Component Value Date   WBC 7.3 06/10/2013   HGB 16.2 06/10/2013   HCT 50.5 06/10/2013   MCV 92.0 06/10/2013   PLT 268 07/18/2012   Lab Results  Component Value Date   CREATININE 1.02 07/18/2012   BUN 14 07/18/2012   NA 137 07/18/2012   K 4.1 07/18/2012   CL 101 07/18/2012   CO2 27 07/18/2012   Lab Results  Component Value Date   ALT 47 07/18/2012   AST 26 07/18/2012   ALKPHOS 39 07/18/2012   BILITOT 0.6 07/18/2012   Lab Results  Component Value Date   CHOL 143 07/18/2012   Lab Results  Component Value Date   HDL 36* 07/18/2012   Lab Results  Component Value Date   LDLCALC 78 07/18/2012   Lab Results  Component Value Date   TRIG 144 07/18/2012   Lab Results  Component Value Date   CHOLHDL 4.0 07/18/2012     Assessment & Plan  Preventative health care Patient declined flu shot today, agreed to fasting labs. Encouraged heart  Healthy diet, regular exercise and adequate sleep  ED (erectile dysfunction) Good response to Levitra no changes  Gout Well controlled on allopurinol  Dyslipidemia Mildly decreased LDL and elevated Triglycerides. Avoid trans fats,  increae exercise, consider addition of omega fatty acids.   Allergic state Responding better to Allegra than Claritin may continue same. Refilled Flonase

## 2013-07-24 NOTE — Patient Instructions (Addendum)
Check with insurance about measles will they pay for [place] to check for immunity or will pay for [place] to boost the MMR shot  Preventive Care for Adults, Male A healthy lifestyle and preventive care can promote health and wellness. Preventive health guidelines for men include the following key practices:  A routine yearly physical is a good way to check with your health care provider about your health and preventative screening. It is a chance to share any concerns and updates on your health and to receive a thorough exam.  Visit your dentist for a routine exam and preventative care every 6 months. Brush your teeth twice a day and floss once a day. Good oral hygiene prevents tooth decay and gum disease.  The frequency of eye exams is based on your age, health, family medical history, use of contact lenses, and other factors. Follow your health care provider's recommendations for frequency of eye exams.  Eat a healthy diet. Foods such as vegetables, fruits, whole grains, low-fat dairy products, and lean protein foods contain the nutrients you need without too many calories. Decrease your intake of foods high in solid fats, added sugars, and salt. Eat the right amount of calories for you.Get information about a proper diet from your health care provider, if necessary.  Regular physical exercise is one of the most important things you can do for your health. Most adults should get at least 150 minutes of moderate-intensity exercise (any activity that increases your heart rate and causes you to sweat) each week. In addition, most adults need muscle-strengthening exercises on 2 or more days a week.  Maintain a healthy weight. The body mass index (BMI) is a screening tool to identify possible weight problems. It provides an estimate of body fat based on height and weight. Your health care provider can find your BMI and can help you achieve or maintain a healthy weight.For adults 20 years and older:  A BMI  below 18.5 is considered underweight.  A BMI of 18.5 to 24.9 is normal.  A BMI of 25 to 29.9 is considered overweight.  A BMI of 30 and above is considered obese.  Maintain normal blood lipids and cholesterol levels by exercising and minimizing your intake of saturated fat. Eat a balanced diet with plenty of fruit and vegetables. Blood tests for lipids and cholesterol should begin at age 51 and be repeated every 5 years. If your lipid or cholesterol levels are high, you are over 50, or you are at high risk for heart disease, you may need your cholesterol levels checked more frequently.Ongoing high lipid and cholesterol levels should be treated with medicines if diet and exercise are not working.  If you smoke, find out from your health care provider how to quit. If you do not use tobacco, do not start.  Lung cancer screening is recommended for adults aged 71 80 years who are at high risk for developing lung cancer because of a history of smoking. A yearly low-dose CT scan of the lungs is recommended for people who have at least a 30-pack-year history of smoking and are a current smoker or have quit within the past 15 years. A pack year of smoking is smoking an average of 1 pack of cigarettes a day for 1 year (for example: 1 pack a day for 30 years or 2 packs a day for 15 years). Yearly screening should continue until the smoker has stopped smoking for at least 15 years. Yearly screening should be stopped for  people who develop a health problem that would prevent them from having lung cancer treatment.  If you choose to drink alcohol, do not have more than 2 drinks per day. One drink is considered to be 12 ounces (355 mL) of beer, 5 ounces (148 mL) of wine, or 1.5 ounces (44 mL) of liquor.  Avoid use of street drugs. Do not share needles with anyone. Ask for help if you need support or instructions about stopping the use of drugs.  High blood pressure causes heart disease and increases the risk of  stroke. Your blood pressure should be checked at least every 1 2 years. Ongoing high blood pressure should be treated with medicines, if weight loss and exercise are not effective.  If you are 1 45 years old, ask your health care provider if you should take aspirin to prevent heart disease.  Diabetes screening involves taking a blood sample to check your fasting blood sugar level. This should be done once every 3 years, after age 20, if you are within normal weight and without risk factors for diabetes. Testing should be considered at a younger age or be carried out more frequently if you are overweight and have at least 1 risk factor for diabetes.  Colorectal cancer can be detected and often prevented. Most routine colorectal cancer screening begins at the age of 18 and continues through age 50. However, your health care provider may recommend screening at an earlier age if you have risk factors for colon cancer. On a yearly basis, your health care provider may provide home test kits to check for hidden blood in the stool. Use of a small camera at the end of a tube to directly examine the colon (sigmoidoscopy or colonoscopy) can detect the earliest forms of colorectal cancer. Talk to your health care provider about this at age 54, when routine screening begins. Direct exam of the colon should be repeated every 5 10 years through age 58, unless early forms of precancerous polyps or small growths are found.  People who are at an increased risk for hepatitis B should be screened for this virus. You are considered at high risk for hepatitis B if:  You were born in a country where hepatitis B occurs often. Talk with your health care provider about which countries are considered high-risk.  Your parents were born in a high-risk country and you have not received a shot to protect against hepatitis B (hepatitis B vaccine).  You have HIV or AIDS.  You use needles to inject street drugs.  You live with, or  have sex with, someone who has hepatitis B.  You are a man who has sex with other men (MSM).  You get hemodialysis treatment.  You take certain medicines for conditions such as cancer, organ transplantation, and autoimmune conditions.  Hepatitis C blood testing is recommended for all people born from [birth year] through 1965 and any individual with known risks for hepatitis C.  Practice safe sex. Use condoms and avoid high-risk sexual practices to reduce the spread of sexually transmitted infections (STIs). STIs include gonorrhea, chlamydia, syphilis, trichomonas, herpes, HPV, and human immunodeficiency virus (HIV). Herpes, HIV, and HPV are viral illnesses that have no cure. They can result in disability, cancer, and death.  A one-time screening for abdominal aortic aneurysm (AAA) and surgical repair of large AAAs by ultrasound are recommended for men ages 64 to 66 years who are current or former smokers.  Healthy men should no longer receive prostate-specific antigen (PSA)  blood tests as part of routine cancer screening. Talk with your health care provider about prostate cancer screening.  Testicular cancer screening is not recommended for adult males who have no symptoms. Screening includes self-exam, a health care provider exam, and other screening tests. Consult with your health care provider about any symptoms you have or any concerns you have about testicular cancer.  Use sunscreen. Apply sunscreen liberally and repeatedly throughout the day. You should seek shade when your shadow is shorter than you. Protect yourself by wearing long sleeves, pants, a wide-brimmed hat, and sunglasses year round, whenever you are outdoors.  Once a month, do a whole-body skin exam, using a mirror to look at the skin on your back. Tell your health care provider about new moles, moles that have irregular borders, moles that are larger than a pencil eraser, or moles that have changed in shape or color.  Stay current  with required vaccines (immunizations).  Influenza vaccine. All adults should be immunized every year.  Tetanus, diphtheria, and acellular pertussis (Td, Tdap) vaccine. An adult who has not previously received Tdap or who does not know his vaccine status should receive 1 dose of Tdap. This initial dose should be followed by tetanus and diphtheria toxoids (Td) booster doses every 10 years. Adults with an unknown or incomplete history of completing a 3-dose immunization series with Td-containing vaccines should begin or complete a primary immunization series including a Tdap dose. Adults should receive a Td booster every 10 years.  Varicella vaccine. An adult without evidence of immunity to varicella should receive 2 doses or a second dose if he has previously received 1 dose.  Human papillomavirus (HPV) vaccine. Males aged 57 21 years who have not received the vaccine previously should receive the 3-dose series. Males aged 39 26 years may be immunized. Immunization is recommended through the age of 37 years for any male who has sex with males and did not get any or all doses earlier. Immunization is recommended for any person with an immunocompromised condition through the age of 16 years if he did not get any or all doses earlier. During the 3-dose series, the second dose should be obtained 4 8 weeks after the first dose. The third dose should be obtained 24 weeks after the first dose and 16 weeks after the second dose.  Zoster vaccine. One dose is recommended for adults aged 63 years or older unless certain conditions are present.  Measles, mumps, and rubella (MMR) vaccine. Adults born before [birth year] generally are considered immune to measles and mumps. Adults born in [birth year] or later should have 1 or more doses of MMR vaccine unless there is a contraindication to the vaccine or there is laboratory evidence of immunity to each of the three diseases. A routine second dose of MMR vaccine should be obtained at  least 28 days after the first dose for students attending postsecondary schools, health care workers, or international travelers. People who received inactivated measles vaccine or an unknown type of measles vaccine during 1963 1967 should receive 2 doses of MMR vaccine. People who received inactivated mumps vaccine or an unknown type of mumps vaccine before 1979 and are at high risk for mumps infection should consider immunization with 2 doses of MMR vaccine. Unvaccinated health care workers born before [birth year] who lack laboratory evidence of measles, mumps, or rubella immunity or laboratory confirmation of disease should consider measles and mumps immunization with 2 doses of MMR vaccine or rubella immunization with 1 dose of  MMR vaccine.  Pneumococcal 13-valent conjugate (PCV13) vaccine. When indicated, a person who is uncertain of his immunization history and has no record of immunization should receive the PCV13 vaccine. An adult aged 67 years or older who has certain medical conditions and has not been previously immunized should receive 1 dose of PCV13 vaccine. This PCV13 should be followed with a dose of pneumococcal polysaccharide (PPSV23) vaccine. The PPSV23 vaccine dose should be obtained at least 8 weeks after the dose of PCV13 vaccine. An adult aged 66 years or older who has certain medical conditions and previously received 1 or more doses of PPSV23 vaccine should receive 1 dose of PCV13. The PCV13 vaccine dose should be obtained 1 or more years after the last PPSV23 vaccine dose.  Pneumococcal polysaccharide (PPSV23) vaccine. When PCV13 is also indicated, PCV13 should be obtained first. All adults aged 43 years and older should be immunized. An adult younger than age 50 years who has certain medical conditions should be immunized. Any person who resides in a nursing home or long-term care facility should be immunized. An adult smoker should be immunized. People with an immunocompromised condition and  certain other conditions should receive both PCV13 and PPSV23 vaccines. People with human immunodeficiency virus (HIV) infection should be immunized as soon as possible after diagnosis. Immunization during chemotherapy or radiation therapy should be avoided. Routine use of PPSV23 vaccine is not recommended for American Indians, [place] Natives, or people younger than 65 years unless there are medical conditions that require PPSV23 vaccine. When indicated, people who have unknown immunization and have no record of immunization should receive PPSV23 vaccine. One-time revaccination 5 years after the first dose of PPSV23 is recommended for people aged 57 64 years who have chronic kidney failure, nephrotic syndrome, asplenia, or immunocompromised conditions. People who received 1 2 doses of PPSV23 before age 11 years should receive another dose of PPSV23 vaccine at age 84 years or later if at least 5 years have passed since the previous dose. Doses of PPSV23 are not needed for people immunized with PPSV23 at or after age 71 years.  Meningococcal vaccine. Adults with asplenia or persistent complement component deficiencies should receive 2 doses of quadrivalent meningococcal conjugate (MenACWY-D) vaccine. The doses should be obtained at least 2 months apart. Microbiologists working with certain meningococcal bacteria, [place] recruits, people at risk during an outbreak, and people who travel to or live in countries with a high rate of meningitis should be immunized. A first-year college student up through age 60 years who is living in a residence hall should receive a dose if he did not receive a dose on or after his 16th birthday. Adults who have certain high-risk conditions should receive one or more doses of vaccine.  Hepatitis A vaccine. Adults who wish to be protected from this disease, have certain high-risk conditions, work with hepatitis A-infected animals, work in hepatitis A research labs, or travel to or  work in countries with a high rate of hepatitis A should be immunized. Adults who were previously unvaccinated and who anticipate close contact with an international adoptee during the first 60 days after arrival in the [place] from a country with a high rate of hepatitis A should be immunized.  Hepatitis B vaccine. Adults who wish to be protected from this disease, have certain high-risk conditions, may be exposed to blood or other infectious body fluids, are household contacts or sex partners of hepatitis B positive people, are clients or workers in certain care facilities,  or travel to or work in countries with a high rate of hepatitis B should be immunized.  Haemophilus influenzae type b (Hib) vaccine. A previously unvaccinated person with asplenia or sickle cell disease or having a scheduled splenectomy should receive 1 dose of Hib vaccine. Regardless of previous immunization, a recipient of a hematopoietic stem cell transplant should receive a 3-dose series 6 12 months after his successful transplant. Hib vaccine is not recommended for adults with HIV infection. Preventive Service / Frequency Ages 45 to 31  Blood pressure check.** / Every 1 to 2 years.  Lipid and cholesterol check.** / Every 5 years beginning at age 43.  Hepatitis C blood test.** / For any individual with known risks for hepatitis C.  Skin self-exam. / Monthly.  Influenza vaccine. / Every year.  Tetanus, diphtheria, and acellular pertussis (Tdap, Td) vaccine.** / Consult your health care provider. 1 dose of Td every 10 years.  Varicella vaccine.** / Consult your health care provider.  HPV vaccine. / 3 doses over 6 months, if 26 or younger.  Measles, mumps, rubella (MMR) vaccine.** / You need at least 1 dose of MMR if you were born in 1957 or later. You may also need a second dose.  Pneumococcal 13-valent conjugate (PCV13) vaccine.** / Consult your health care provider.  Pneumococcal polysaccharide (PPSV23)  vaccine.** / 1 to 2 doses if you smoke cigarettes or if you have certain conditions.  Meningococcal vaccine.** / 1 dose if you are age 26 to 35 years and a Orthoptist living in a residence hall, or have one of several medical conditions. You may also need additional booster doses.  Hepatitis A vaccine.** / Consult your health care provider.  Hepatitis B vaccine.** / Consult your health care provider.  Haemophilus influenzae type b (Hib) vaccine.** / Consult your health care provider. Ages 84 to 100  Blood pressure check.** / Every 1 to 2 years.  Lipid and cholesterol check.** / Every 5 years beginning at age 89.  Lung cancer screening. / Every year if you are aged 62 80 years and have a 30-pack-year history of smoking and currently smoke or have quit within the past 15 years. Yearly screening is stopped once you have quit smoking for at least 15 years or develop a health problem that would prevent you from having lung cancer treatment.  Fecal occult blood test (FOBT) of stool. / Every year beginning at age 21 and continuing until age 27. You may not have to do this test if you get a colonoscopy every 10 years.  Flexible sigmoidoscopy** or colonoscopy.** / Every 5 years for a flexible sigmoidoscopy or every 10 years for a colonoscopy beginning at age 61 and continuing until age 27.  Hepatitis C blood test.** / For all people born from [birth year] through 1967 and any individual with known risks for hepatitis C.  Skin self-exam. / Monthly.  Influenza vaccine. / Every year.  Tetanus, diphtheria, and acellular pertussis (Tdap/Td) vaccine.** / Consult your health care provider. 1 dose of Td every 10 years.  Varicella vaccine.** / Consult your health care provider.  Zoster vaccine.** / 1 dose for adults aged 64 years or older.  Measles, mumps, rubella (MMR) vaccine.** / You need at least 1 dose of MMR if you were born in 1959 or later. You may also need a second  dose.  Pneumococcal 13-valent conjugate (PCV13) vaccine.** / Consult your health care provider.  Pneumococcal polysaccharide (PPSV23) vaccine.** / 1 to 2 doses if you smoke  cigarettes or if you have certain conditions.  Meningococcal vaccine.** / Consult your health care provider.  Hepatitis A vaccine.** / Consult your health care provider.  Hepatitis B vaccine.** / Consult your health care provider.  Haemophilus influenzae type b (Hib) vaccine.** / Consult your health care provider. Ages 23 and over  Blood pressure check.** / Every 1 to 2 years.  Lipid and cholesterol check.**/ Every 5 years beginning at age 2.  Lung cancer screening. / Every year if you are aged 48 80 years and have a 30-pack-year history of smoking and currently smoke or have quit within the past 15 years. Yearly screening is stopped once you have quit smoking for at least 15 years or develop a health problem that would prevent you from having lung cancer treatment.  Fecal occult blood test (FOBT) of stool. / Every year beginning at age 92 and continuing until age 40. You may not have to do this test if you get a colonoscopy every 10 years.  Flexible sigmoidoscopy** or colonoscopy.** / Every 5 years for a flexible sigmoidoscopy or every 10 years for a colonoscopy beginning at age 63 and continuing until age 14.  Hepatitis C blood test.** / For all people born from [birth year] through 1967 and any individual with known risks for hepatitis C.  Abdominal aortic aneurysm (AAA) screening.** / A one-time screening for ages 52 to 7 years who are current or former smokers.  Skin self-exam. / Monthly.  Influenza vaccine. / Every year.  Tetanus, diphtheria, and acellular pertussis (Tdap/Td) vaccine.** / 1 dose of Td every 10 years.  Varicella vaccine.** / Consult your health care provider.  Zoster vaccine.** / 1 dose for adults aged 93 years or older.  Pneumococcal 13-valent conjugate (PCV13) vaccine.** / Consult your  health care provider.  Pneumococcal polysaccharide (PPSV23) vaccine.** / 1 dose for all adults aged 51 years and older.  Meningococcal vaccine.** / Consult your health care provider.  Hepatitis A vaccine.** / Consult your health care provider.  Hepatitis B vaccine.** / Consult your health care provider.  Haemophilus influenzae type b (Hib) vaccine.** / Consult your health care provider. **Family history and personal history of risk and conditions may change your health care provider's recommendations. Document Released: 07/21/2001 Document Revised: 03/15/2013 Document Reviewed: 10/20/2010 [facility] Patient Information 2014 [place], [place]. DASH Diet The DASH diet stands for "Dietary Approaches to Stop Hypertension." It is a healthy eating plan that has been shown to reduce high blood pressure (hypertension) in as little as 14 days, while also possibly providing other significant health benefits. These other health benefits include reducing the risk of breast cancer after menopause and reducing the risk of type 2 diabetes, heart disease, colon cancer, and stroke. Health benefits also include weight loss and slowing kidney failure in patients with chronic kidney disease.  DIET GUIDELINES  Limit salt (sodium). Your diet should contain less than 1500 mg of sodium daily.  Limit refined or processed carbohydrates. Your diet should include mostly whole grains. Desserts and added sugars should be used sparingly.  Include small amounts of heart-healthy fats. These types of fats include nuts, oils, and tub margarine. Limit saturated and trans fats. These fats have been shown to be harmful in the body. CHOOSING FOODS  The following food groups are based on a 2000 calorie diet. See your Registered Dietitian for individual calorie needs. Grains and Grain Products (6 to 8 servings daily)  Eat More Often: Whole-wheat bread, brown rice, whole-grain or wheat pasta, quinoa, popcorn without added  fat or salt  (air popped).  Eat Less Often: White bread, white pasta, white rice, cornbread. Vegetables (4 to 5 servings daily)  Eat More Often: Fresh, frozen, and canned vegetables. Vegetables may be raw, steamed, roasted, or grilled with a minimal amount of fat.  Eat Less Often/Avoid: Creamed or fried vegetables. Vegetables in a cheese sauce. Fruit (4 to 5 servings daily)  Eat More Often: All fresh, canned (in natural juice), or frozen fruits. Dried fruits without added sugar. One hundred percent fruit juice ( cup [237 mL] daily).  Eat Less Often: Dried fruits with added sugar. Canned fruit in light or heavy syrup. [employer], Fish, and Poultry (2 servings or less daily. One serving is 3 to 4 oz [85-114 g]).  Eat More Often: Ninety percent or leaner ground beef, tenderloin, sirloin. Round cuts of beef, chicken breast, [place] breast. All fish. Grill, bake, or broil your meat. Nothing should be fried.  Eat Less Often/Avoid: Fatty cuts of meat, [place], or chicken leg, thigh, or wing. Fried cuts of meat or fish. Dairy (2 to 3 servings)  Eat More Often: Low-fat or fat-free milk, low-fat plain or light yogurt, reduced-fat or part-skim cheese.  Eat Less Often/Avoid: Milk (whole, 2%).Whole milk yogurt. Full-fat cheeses. Nuts, Seeds, and Legumes (4 to 5 servings per week)  Eat More Often: All without added salt.  Eat Less Often/Avoid: Salted nuts and seeds, canned beans with added salt. Fats and Sweets (limited)  Eat More Often: Vegetable oils, tub margarines without trans fats, sugar-free gelatin. Mayonnaise and salad dressings.  Eat Less Often/Avoid: Coconut oils, palm oils, butter, stick margarine, cream, half and half, cookies, candy, pie. FOR MORE INFORMATION The Dash Diet Eating Plan: www.dashdiet.org Document Released: 05/14/2011 Document Revised: 08/17/2011 Document Reviewed: 05/14/2011 [facility] Patient Information 2014 [place], [place].

## 2013-07-24 NOTE — Progress Notes (Signed)
Pre visit review using our clinic review tool, if applicable. No additional management support is needed unless otherwise documented below in the visit note. 

## 2013-07-25 LAB — TSH: TSH: 1.839 u[IU]/mL (ref 0.350–4.500)

## 2013-07-26 ENCOUNTER — Encounter: Payer: Self-pay | Admitting: Family Medicine

## 2013-07-26 DIAGNOSIS — E785 Hyperlipidemia, unspecified: Secondary | ICD-10-CM

## 2013-07-26 HISTORY — DX: Hyperlipidemia, unspecified: E78.5

## 2013-07-26 MED ORDER — FLUTICASONE PROPIONATE 50 MCG/ACT NA SUSP: 2.0000 | Freq: Every day | NASAL | Status: AC | PRN

## 2013-07-26 NOTE — Assessment & Plan Note (Signed)
Well controlled on allopurinol

## 2013-07-26 NOTE — Assessment & Plan Note (Signed)
Mildly decreased LDL and elevated Triglycerides. Avoid trans fats, increae exercise, consider addition of omega fatty acids.

## 2013-07-26 NOTE — Assessment & Plan Note (Addendum)
Responding better to Allegra than Claritin may continue same. Refilled Flonase

## 2013-07-26 NOTE — Assessment & Plan Note (Signed)
Good response to Levitra no changes

## 2013-07-26 NOTE — Assessment & Plan Note (Signed)
Patient declined flu shot today, agreed to fasting labs. Encouraged heart  Healthy diet, regular exercise and adequate sleep

## 2013-10-20 ENCOUNTER — Ambulatory Visit (INDEPENDENT_AMBULATORY_CARE_PROVIDER_SITE_OTHER): Payer: Managed Care, Other (non HMO) | Admitting: Physician Assistant

## 2013-10-20 ENCOUNTER — Ambulatory Visit: Payer: Managed Care, Other (non HMO)

## 2013-10-20 VITALS — BP 118/70 | HR 88 | Temp 98.0°F | Resp 16 | Ht 70.0 in | Wt 227.4 lb

## 2013-10-20 DIAGNOSIS — J329 Chronic sinusitis, unspecified: Secondary | ICD-10-CM

## 2013-10-20 DIAGNOSIS — R059 Cough, unspecified: Secondary | ICD-10-CM

## 2013-10-20 DIAGNOSIS — R05 Cough: Secondary | ICD-10-CM

## 2013-10-20 DIAGNOSIS — R0602 Shortness of breath: Secondary | ICD-10-CM

## 2013-10-20 LAB — POCT CBC
Granulocyte percent: 67.6 %G (ref 37–80)
HCT, POC: 47 % (ref 43.5–53.7)
Hemoglobin: 15.6 g/dL (ref 14.1–18.1)
Lymph, poc: 1.8 (ref 0.6–3.4)
MCH, POC: 29.6 pg (ref 27–31.2)
MCHC: 33.2 g/dL (ref 31.8–35.4)
MCV: 89.2 fL (ref 80–97)
MID (cbc): 0.6 (ref 0–0.9)
MPV: 9.6 fL (ref 0–99.8)
POC Granulocyte: 5.1 (ref 2–6.9)
POC LYMPH PERCENT: 24.1 %L (ref 10–50)
POC MID %: 8.3 %M (ref 0–12)
Platelet Count, POC: 333 10*3/uL (ref 142–424)
RBC: 5.27 M/uL (ref 4.69–6.13)
RDW, POC: 14.4 %
WBC: 7.5 10*3/uL (ref 4.6–10.2)

## 2013-10-20 MED ORDER — PREDNISONE 20 MG PO TABS: 20.0000 mg | ORAL_TABLET | Freq: Every day | ORAL | Status: DC

## 2013-10-20 MED ORDER — AMOXICILLIN-POT CLAVULANATE 875-125 MG PO TABS: 1.0000 | ORAL_TABLET | Freq: Two times a day (BID) | ORAL | Status: DC

## 2013-10-20 MED ORDER — HYDROCOD POLST-CHLORPHEN POLST 10-8 MG/5ML PO LQCR: 5.0000 mL | Freq: Two times a day (BID) | ORAL | Status: DC | PRN

## 2013-10-20 NOTE — Progress Notes (Signed)
   Subjective:    Patient ID: Michael Howe, male    DOB: May 25, 1971, 43 y.o.   MRN: [id number]  HPI 43 year old male presents for evaluation of 2 week history of nasal congestion, sinus pressure, PND, cough, ear pressure, and fatigue. Symptoms have been progressively worsening despite tx with OTC cold medicines.  Denies fever, chills, nausea, vomiting, headache, dizziness, otalgia, chest pain, SOB, or wheezing.   Admits he was treated for pneumonia in 06/2013 - states this "does not feel the same." Was instructed to get a repeat CXR 6 weeks after tx to ensure resolution; did not do this but admits he felt 100% better after antibiotics.   Patient is otherwise doing well with no other concerns today.     Review of Systems  Constitutional: Negative for fever and chills.  HENT: Positive for congestion, postnasal drip, rhinorrhea and sinus pressure. Negative for ear pain and sore throat.   Respiratory: Positive for cough. Negative for chest tightness, shortness of breath and wheezing.   Cardiovascular: Negative for chest pain.  Gastrointestinal: Negative for nausea and vomiting.  Neurological: Negative for dizziness and headaches.       Objective:   Physical Exam  Constitutional: He is oriented to person, place, and time. He appears well-developed and well-nourished.  HENT:  Head: Normocephalic and atraumatic.  Right Ear: Hearing, tympanic membrane, external ear and ear canal normal.  Left Ear: Hearing, tympanic membrane, external ear and ear canal normal.  Mouth/Throat: Uvula is midline, oropharynx is clear and moist and mucous membranes are normal.  Eyes: Conjunctivae are normal.  Neck: Normal range of motion. Neck supple.  Cardiovascular: Normal rate, regular rhythm and normal heart sounds.   Pulmonary/Chest: Effort normal and breath sounds normal.  Lymphadenopathy:    He has no cervical adenopathy.  Neurological: He is alert and oriented to person, place, and time.  Psychiatric: He  has a normal mood and affect. His behavior is normal. Judgment and thought content normal.      [facility] reading (PRIMARY) by  Dr. Laney Pastor as no acute infiltrate or consolidation. Previous left lower lobe pneumonia resolved.      Assessment & Plan:  Cough - Plan: DG Chest 2 View, POCT CBC, chlorpheniramine-HYDROcodone (TUSSIONEX PENNKINETIC ER) 10-8 MG/5ML LQCR  Sinus infection - Plan: amoxicillin-clavulanate (AUGMENTIN) 875-125 MG per tablet  SOB (shortness of breath) - Plan: predniSONE (DELTASONE) 20 MG tablet  Will treat sinus infection with Augmentin 875 mg bid x 10 days Prednisone taper. Tussionex qhs prn cough.  Push fluids. Continue Mucinex twice daily RTC precautions discussed. Follow up if symptoms worsen or fail to improve.

## 2013-10-28 ENCOUNTER — Emergency Department (HOSPITAL_BASED_OUTPATIENT_CLINIC_OR_DEPARTMENT_OTHER)
Admission: EM | Admit: 2013-10-28 | Discharge: 2013-10-28 | Disposition: A | Discharge status: Home / Self Care | Payer: Managed Care, Other (non HMO) | Attending: Emergency Medicine | Admitting: Emergency Medicine

## 2013-10-28 ENCOUNTER — Emergency Department (HOSPITAL_BASED_OUTPATIENT_CLINIC_OR_DEPARTMENT_OTHER): Payer: Managed Care, Other (non HMO)

## 2013-10-28 ENCOUNTER — Encounter (HOSPITAL_BASED_OUTPATIENT_CLINIC_OR_DEPARTMENT_OTHER): Payer: Self-pay | Admitting: Emergency Medicine

## 2013-10-28 DIAGNOSIS — R42 Dizziness and giddiness: Secondary | ICD-10-CM | POA: Insufficient documentation

## 2013-10-28 DIAGNOSIS — Z79899 Other long term (current) drug therapy: Secondary | ICD-10-CM | POA: Insufficient documentation

## 2013-10-28 DIAGNOSIS — Z792 Long term (current) use of antibiotics: Secondary | ICD-10-CM | POA: Insufficient documentation

## 2013-10-28 DIAGNOSIS — R209 Unspecified disturbances of skin sensation: Secondary | ICD-10-CM | POA: Insufficient documentation

## 2013-10-28 DIAGNOSIS — IMO0002 Reserved for concepts with insufficient information to code with codable children: Secondary | ICD-10-CM | POA: Insufficient documentation

## 2013-10-28 DIAGNOSIS — M109 Gout, unspecified: Secondary | ICD-10-CM | POA: Insufficient documentation

## 2013-10-28 DIAGNOSIS — R200 Anesthesia of skin: Secondary | ICD-10-CM

## 2013-10-28 LAB — BASIC METABOLIC PANEL
BUN: 12 mg/dL (ref 6–23)
CO2: 25 mEq/L (ref 19–32)
Calcium: 9.5 mg/dL (ref 8.4–10.5)
Chloride: 102 mEq/L (ref 96–112)
Creatinine, Ser: 0.9 mg/dL (ref 0.50–1.35)
GFR calc non Af Amer: >90 mL/min (ref >90)
GLUCOSE: 138 mg/dL — AB (ref 70–99)
POTASSIUM: 4 meq/L (ref 3.7–5.3)
SODIUM: 139 meq/L (ref 137–147)

## 2013-10-28 LAB — CBC WITH DIFFERENTIAL/PLATELET
Basophils Absolute: 0 10*3/uL (ref 0.0–0.1)
Basophils Relative: 0 % (ref 0–1)
Eosinophils Absolute: 0.1 10*3/uL (ref 0.0–0.7)
Eosinophils Relative: 2 % (ref 0–5)
HCT: 44.7 % (ref 39.0–52.0)
Hemoglobin: 15.8 g/dL (ref 13.0–17.0)
LYMPHS ABS: 1.4 10*3/uL (ref 0.7–4.0)
Lymphocytes Relative: 26 % (ref 12–46)
MCH: 30.3 pg (ref 26.0–34.0)
MCHC: 35.3 g/dL (ref 30.0–36.0)
MCV: 85.6 fL (ref 78.0–100.0)
Monocytes Absolute: 0.6 10*3/uL (ref 0.1–1.0)
Monocytes Relative: 12 % (ref 3–12)
NEUTROS PCT: 60 % (ref 43–77)
Neutro Abs: 3.1 10*3/uL (ref 1.7–7.7)
Platelets: 266 10*3/uL (ref 150–400)
RBC: 5.22 MIL/uL (ref 4.22–5.81)
RDW: 13.6 % (ref 11.5–15.5)
WBC: 5.2 10*3/uL (ref 4.0–10.5)

## 2013-10-28 LAB — URINALYSIS, ROUTINE W REFLEX MICROSCOPIC
Bilirubin Urine: NEGATIVE
Glucose, UA: NEGATIVE mg/dL
HGB URINE DIPSTICK: NEGATIVE
Ketones, ur: NEGATIVE mg/dL
LEUKOCYTES UA: NEGATIVE
NITRITE: NEGATIVE
Protein, ur: NEGATIVE mg/dL
SPECIFIC GRAVITY, URINE: 1.008 (ref 1.005–1.030)
UROBILINOGEN UA: 0.2 mg/dL (ref 0.0–1.0)
pH: 6.5 (ref 5.0–8.0)

## 2013-10-28 NOTE — ED Notes (Signed)
Patient c/o intermittent dizziness and numbness/tingling on left side of face that travels down to fingers of left hand. Able to drink and eat, oriented

## 2013-10-28 NOTE — ED Provider Notes (Signed)
CSN: [id number]     Arrival date & time 10/28/13  1001 History   First MD Initiated Contact with Patient 10/28/13 1024     Chief Complaint  Patient presents with  . Numbness  . Dizziness     (Consider location/radiation/quality/duration/timing/severity/associated sxs/prior Treatment) Patient is a 43 y.o. male presenting with neurologic complaint.  Neurologic Problem This is a new (Left facial numbness and left upper extremity numbness.) problem. The current episode started yesterday. The problem occurs constantly. Progression since onset: Waxing and waning. Pertinent negatives include no chest pain, no abdominal pain, no headaches and no shortness of breath. Associated symptoms comments: Mild dizziness. Exacerbated by: Coughing. Nothing relieves the symptoms. He has tried nothing for the symptoms.    Past Medical History  Diagnosis Date  . Gout   . Preventative health care 07/14/2012  . Allergic state 02/01/2012  . Allergy   . Dyslipidemia 07/26/2013   Past Surgical History  Procedure Laterality Date  . Tonsillectomy and adenoidectomy  1975   Family History  Problem Relation Age of Onset  . Gout Maternal Grandmother   . Heart disease Maternal Grandmother   . Cancer Maternal Grandmother   . Hyperlipidemia Mother   . Diabetes Mother   . Bowel Disease Mother     IBS  . Heart disease Sister   . Autism Son   . Stroke Paternal Grandmother   . Alcohol abuse Father    History  Substance Use Topics  . Smoking status: Never Smoker   . Smokeless tobacco: Not on file  . Alcohol Use: No    Review of Systems  Respiratory: Negative for shortness of breath.   Cardiovascular: Negative for chest pain.  Gastrointestinal: Negative for abdominal pain.  Neurological: Negative for headaches.  All other systems reviewed and are negative.     Allergies  Review of patient's allergies indicates no known allergies.  Home Medications   Prior to Admission medications   Medication Sig  Start Date End Date Taking? Authorizing Provider  KRILL OIL PO Take by mouth.   Yes Historical Provider, MD  allopurinol (ZYLOPRIM) 300 MG tablet Take 1 tablet (300 mg total) by mouth daily. 02/13/13   Mosie Lukes, MD  amoxicillin-clavulanate (AUGMENTIN) 875-125 MG per tablet Take 1 tablet by mouth 2 (two) times daily. 10/20/13   Heather Elnora Morrison, PA-C  chlorpheniramine-HYDROcodone (TUSSIONEX PENNKINETIC ER) 10-8 MG/5ML LQCR Take 5 mLs by mouth every 12 (twelve) hours as needed for cough (cough). 10/20/13   Heather Elnora Morrison, PA-C  fexofenadine (ALLEGRA) 180 MG tablet Take 1 tablet (180 mg total) by mouth daily as needed for allergies or rhinitis. 07/24/13   Mosie Lukes, MD  fluticasone (FLONASE) 50 MCG/ACT nasal spray Place 2 sprays into both nostrils daily as needed for allergies or rhinitis. 07/26/13   Mosie Lukes, MD  Hydroquinone Warner Mccreedy EX) Apply topically 1 day or 1 dose.    Historical Provider, MD  indomethacin (INDOCIN) 50 MG capsule Take 1 capsule (50 mg total) by mouth 2 (two) times daily as needed for moderate pain. 07/24/13   Mosie Lukes, MD  NON FORMULARY Life way Kefir- drink 8 oz daily    Historical Provider, MD  predniSONE (DELTASONE) 20 MG tablet Take 1 tablet (20 mg total) by mouth daily with breakfast. Take 2 tablets x 3 days, then 1 tablet x 3 days 10/20/13   Collene Leyden, PA-C  vardenafil (LEVITRA) 20 MG tablet Take 0.5 tablets (10 mg total) by mouth daily  as needed. 02/13/13   Mosie Lukes, MD   BP 121/73  Pulse 74  Temp(Src) 98.1 F (36.7 C) (Oral)  Resp 20  Ht 5\' 10"  (1.778 m)  Wt 220 lb (99.791 kg)  BMI 31.57 kg/m2  SpO2 97% Physical Exam  Nursing note and vitals reviewed. Constitutional: He is oriented to person, place, and time. He appears well-developed and well-nourished. No distress.  HENT:  Head: Normocephalic and atraumatic.  Mouth/Throat: Oropharynx is clear and moist.  Eyes: Conjunctivae are normal. Pupils are equal, round, and reactive to light. No  scleral icterus.  Neck: Neck supple.  Cardiovascular: Normal rate, regular rhythm, normal heart sounds and intact distal pulses.   No murmur heard. Pulmonary/Chest: Effort normal and breath sounds normal. No stridor. No respiratory distress. He has no wheezes. He has no rales.  Abdominal: Soft. He exhibits no distension. There is no tenderness.  Musculoskeletal: Normal range of motion. He exhibits no edema.  Neurological: He is alert and oriented to person, place, and time. He has normal strength. No cranial nerve deficit or sensory deficit. Coordination normal. GCS eye subscore is 4. GCS verbal subscore is 5. GCS motor subscore is 6.  Normal sensory exam  Skin: Skin is warm and dry. No rash noted.  Psychiatric: He has a normal mood and affect. His behavior is normal.    ED Course  Procedures (including critical care time) Labs Review Labs Reviewed  BASIC METABOLIC PANEL - Abnormal; Notable for the following:    Glucose, Bld 138 (*)    All other components within normal limits  CBC WITH DIFFERENTIAL  URINALYSIS, ROUTINE W REFLEX MICROSCOPIC    Imaging Review Ct Head Wo Contrast  10/28/2013   CLINICAL DATA:  Left facial numbness and tingling  EXAM: CT HEAD WITHOUT CONTRAST  TECHNIQUE: Contiguous axial images were obtained from the base of the skull through the vertex without intravenous contrast.  COMPARISON:  None.  FINDINGS: There is no evidence of acute intracranial hemorrhage, brain edema, mass lesion, acute infarction, mass effect, or midline shift. Acute infarct may be inapparent on noncontrast CT. No other intra-axial abnormalities are seen, and the ventricles and sulci are within normal limits in size and symmetry. No abnormal extra-axial fluid collections or masses are identified. No significant calvarial abnormality.  IMPRESSION: 1. Negative for bleed or other acute intracranial process.   Electronically Signed   By: Arne Cleveland M.D.   On: 10/28/2013 11:15  All radiology  studies independently viewed by me.      EKG Interpretation None      MDM   Final diagnoses:  Left sided numbness    43 year old presenting with numbness to the left side of his face and left upper extremity. Started yesterday. Normal neurologic exam. No sensory deficits appreciated on testing. He has been suffering with URI for about a month, has been normal indications including different cough medicines, antibiotics, and prednisone.  Labs and imaging negative.  Pt remained well appearing.  Don't suspect CVA based on clinical picture.  Advised close PCP follow up.    Houston Siren III, MD 10/28/13 [phone]

## 2014-05-02 ENCOUNTER — Other Ambulatory Visit: Payer: Self-pay | Admitting: Internal Medicine

## 2014-05-02 ENCOUNTER — Telehealth: Payer: Self-pay | Admitting: Family Medicine

## 2014-05-02 NOTE — Telephone Encounter (Signed)
Caller name: Rehman Relation to pt: self Call back number: [phone] Pharmacy: [facility] [address]  Reason for call:   Patient requesting a refill of levitra 20mg , two pills

## 2014-06-28 ENCOUNTER — Encounter (HOSPITAL_BASED_OUTPATIENT_CLINIC_OR_DEPARTMENT_OTHER): Payer: Self-pay | Admitting: Emergency Medicine

## 2014-06-28 ENCOUNTER — Emergency Department (HOSPITAL_BASED_OUTPATIENT_CLINIC_OR_DEPARTMENT_OTHER)
Admission: EM | Admit: 2014-06-28 | Discharge: 2014-06-28 | Disposition: A | Discharge status: Home / Self Care | Payer: Managed Care, Other (non HMO) | Attending: Emergency Medicine | Admitting: Emergency Medicine

## 2014-06-28 DIAGNOSIS — L02219 Cutaneous abscess of trunk, unspecified: Secondary | ICD-10-CM | POA: Diagnosis not present

## 2014-06-28 DIAGNOSIS — M109 Gout, unspecified: Secondary | ICD-10-CM | POA: Insufficient documentation

## 2014-06-28 DIAGNOSIS — Z7952 Long term (current) use of systemic steroids: Secondary | ICD-10-CM | POA: Diagnosis not present

## 2014-06-28 DIAGNOSIS — Z7951 Long term (current) use of inhaled steroids: Secondary | ICD-10-CM | POA: Insufficient documentation

## 2014-06-28 DIAGNOSIS — Z79899 Other long term (current) drug therapy: Secondary | ICD-10-CM | POA: Insufficient documentation

## 2014-06-28 DIAGNOSIS — Z792 Long term (current) use of antibiotics: Secondary | ICD-10-CM | POA: Diagnosis not present

## 2014-06-28 DIAGNOSIS — L03319 Cellulitis of trunk, unspecified: Secondary | ICD-10-CM | POA: Insufficient documentation

## 2014-06-28 DIAGNOSIS — Z8639 Personal history of other endocrine, nutritional and metabolic disease: Secondary | ICD-10-CM | POA: Diagnosis not present

## 2014-06-28 MED ORDER — LIDOCAINE-EPINEPHRINE 2 %-1:100000 IJ SOLN
20.0000 mL | Freq: Once | INTRAMUSCULAR | Status: AC
  Administered 2014-06-28: 20 mL via INTRADERMAL
  Filled 2014-06-28: qty 1

## 2014-06-28 MED ORDER — DOXYCYCLINE HYCLATE 100 MG PO CAPS: 100.0000 mg | ORAL_CAPSULE | Freq: Two times a day (BID) | ORAL | Status: DC

## 2014-06-28 MED ORDER — DOXYCYCLINE HYCLATE 100 MG PO TABS
100.0000 mg | ORAL_TABLET | Freq: Two times a day (BID) | ORAL | Status: DC
  Administered 2014-06-28: 100 mg via ORAL
  Filled 2014-06-28: qty 1
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
## 2014-06-28 NOTE — Discharge Instructions (Signed)

## 2014-06-28 NOTE — ED Notes (Signed)
Red sore area to left side x 1 day   Denies inj

## 2014-06-28 NOTE — ED Notes (Signed)
Pt noticed a red area on left low back today.  Appears to be abscess.

## 2014-12-07 ENCOUNTER — Telehealth: Payer: Self-pay | Admitting: Family Medicine

## 2014-12-07 DIAGNOSIS — M109 Gout, unspecified: Secondary | ICD-10-CM

## 2014-12-07 NOTE — Telephone Encounter (Signed)
Called the patient informed he would need appointment with Dr. Charlett Blake for refills.  His last appointment with Dr. Charlett Blake was 07/24/13.  Patient stated he would call back at a later time to schedule.

## 2014-12-07 NOTE — Telephone Encounter (Signed)
Caller name:Chaka Relationship to patient: self  Can be reached: [phone] Pharmacy: [facility]  Reason for call: needs refill for indocin 50 mg take when needed

## 2015-03-01 ENCOUNTER — Ambulatory Visit (INDEPENDENT_AMBULATORY_CARE_PROVIDER_SITE_OTHER): Payer: Managed Care, Other (non HMO) | Admitting: Family Medicine

## 2015-03-01 VITALS — BP 116/72 | HR 84 | Temp 98.3°F | Resp 18 | Ht 70.0 in | Wt 235.0 lb

## 2015-03-01 DIAGNOSIS — R1011 Right upper quadrant pain: Secondary | ICD-10-CM

## 2015-03-01 DIAGNOSIS — R1013 Epigastric pain: Secondary | ICD-10-CM | POA: Diagnosis not present

## 2015-03-01 DIAGNOSIS — R1031 Right lower quadrant pain: Secondary | ICD-10-CM | POA: Diagnosis not present

## 2015-03-01 LAB — COMPREHENSIVE METABOLIC PANEL
ALBUMIN: 4.2 g/dL (ref 3.6–5.1)
ALT: 41 U/L (ref 9–46)
AST: 23 U/L (ref 10–40)
Alkaline Phosphatase: 41 U/L (ref 40–115)
BILIRUBIN TOTAL: 0.3 mg/dL (ref 0.2–1.2)
BUN: 12 mg/dL (ref 7–25)
CO2: 23 mmol/L (ref 20–31)
CREATININE: 0.98 mg/dL (ref 0.60–1.35)
Calcium: 9.2 mg/dL (ref 8.6–10.3)
Chloride: 104 mmol/L (ref 98–110)
GLUCOSE: 94 mg/dL (ref 65–99)
Potassium: 3.9 mmol/L (ref 3.5–5.3)
SODIUM: 138 mmol/L (ref 135–146)
Total Protein: 6.6 g/dL (ref 6.1–8.1)

## 2015-03-01 LAB — CBC WITH DIFFERENTIAL/PLATELET
Basophils Absolute: 0.1 10*3/uL (ref 0.0–0.1)
Basophils Relative: 1 % (ref 0–1)
EOS PCT: 3 % (ref 0–5)
Eosinophils Absolute: 0.2 10*3/uL (ref 0.0–0.7)
HCT: 44.5 % (ref 39.0–52.0)
Hemoglobin: 15.3 g/dL (ref 13.0–17.0)
LYMPHS ABS: 1.8 10*3/uL (ref 0.7–4.0)
Lymphocytes Relative: 32 % (ref 12–46)
MCH: 29.4 pg (ref 26.0–34.0)
MCHC: 34.4 g/dL (ref 30.0–36.0)
MCV: 85.4 fL (ref 78.0–100.0)
MONOS PCT: 11 % (ref 3–12)
MPV: 9.8 fL (ref 8.6–12.4)
Monocytes Absolute: 0.6 10*3/uL (ref 0.1–1.0)
Neutro Abs: 3 10*3/uL (ref 1.7–7.7)
Neutrophils Relative %: 53 % (ref 43–77)
PLATELETS: 267 10*3/uL (ref 150–400)
RBC: 5.21 MIL/uL (ref 4.22–5.81)
RDW: 14.1 % (ref 11.5–15.5)
WBC: 5.6 10*3/uL (ref 4.0–10.5)

## 2015-03-01 MED ORDER — OMEPRAZOLE 20 MG PO CPDR: 20.0000 mg | DELAYED_RELEASE_CAPSULE | Freq: Every day | ORAL | Status: DC

## 2015-03-01 NOTE — Patient Instructions (Signed)

## 2015-03-01 NOTE — Progress Notes (Signed)
Patient ID: Etan Vasudevan, male    DOB: [birth year]  Age: 44 y.o. MRN: [id number]    Subjective:  Subjective HPI Rakesh Dutko presents for abd pain and bloating since Wednesday.  No nvd, no fever.    Review of Systems  Constitutional: Negative for diaphoresis, appetite change, fatigue and unexpected weight change.  Eyes: Negative for pain, redness and visual disturbance.  Respiratory: Negative for cough, chest tightness, shortness of breath and wheezing.   Cardiovascular: Negative for chest pain, palpitations and leg swelling.  Gastrointestinal: Positive for abdominal pain and abdominal distention. Negative for nausea, vomiting, diarrhea, constipation, blood in stool, anal bleeding and rectal pain.  Endocrine: Negative for cold intolerance, heat intolerance, polydipsia, polyphagia and polyuria.  Genitourinary: Negative for dysuria, frequency and difficulty urinating.  Neurological: Negative for dizziness, light-headedness, numbness and headaches.    History Past Medical History  Diagnosis Date  . Gout   . Preventative health care 07/14/2012  . Allergic state 02/01/2012  . Allergy   . Dyslipidemia 07/26/2013    He has past surgical history that includes Tonsillectomy and adenoidectomy (1975).   His family history includes Alcohol abuse in his father; Autism in his son; Bowel Disease in his mother; Cancer in his maternal grandmother; Diabetes in his mother; Gout in his maternal grandmother; Heart disease in his maternal grandmother and sister; Hyperlipidemia in his mother; Stroke in his paternal grandmother.He reports that he has never smoked. He does not have any smokeless tobacco history on file. He reports that he does not drink alcohol or use illicit drugs.  Current Outpatient Prescriptions on File Prior to Visit  Medication Sig Dispense Refill  . allopurinol (ZYLOPRIM) 300 MG tablet Take 1 tablet (300 mg total) by mouth daily. 30 tablet 3  . chlorpheniramine-HYDROcodone (TUSSIONEX  PENNKINETIC ER) 10-8 MG/5ML LQCR Take 5 mLs by mouth every 12 (twelve) hours as needed for cough (cough). 60 mL 0  . fexofenadine (ALLEGRA) 180 MG tablet Take 1 tablet (180 mg total) by mouth daily as needed for allergies or rhinitis.    . fluticasone (FLONASE) 50 MCG/ACT nasal spray Place 2 sprays into both nostrils daily as needed for allergies or rhinitis. 16 g 2  . Hydroquinone (ALERA EX) Apply topically 1 day or 1 dose.    . indomethacin (INDOCIN) 50 MG capsule Take 1 capsule (50 mg total) by mouth 2 (two) times daily as needed for moderate pain. 60 capsule 1  . KRILL OIL PO Take by mouth.    Marland Kitchen LEVITRA 20 MG tablet TAKE ONE TABLET BY MOUTH EVERY DAY AS NEEDED 6 tablet 3  . NON FORMULARY Life way Kefir- drink 8 oz daily     No current facility-administered medications on file prior to visit.     Objective:  Objective Physical Exam  Constitutional: He is oriented to person, place, and time. Vital signs are normal. He appears well-developed and well-nourished. He is sleeping.  HENT:  Head: Normocephalic and atraumatic.  Mouth/Throat: Oropharynx is clear and moist.  Eyes: EOM are normal. Pupils are equal, round, and reactive to light.  Neck: Normal range of motion. Neck supple. No thyromegaly present.  Cardiovascular: Normal rate and regular rhythm.   No murmur heard. Pulmonary/Chest: Effort normal and breath sounds normal. No respiratory distress. He has no wheezes. He has no rales. He exhibits no tenderness.  Abdominal: He exhibits no distension and no mass. There is tenderness. There is guarding. There is no rebound.    Musculoskeletal: He exhibits no edema or tenderness.  Neurological: He is alert and oriented to person, place, and time.  Skin: Skin is warm and dry.  Psychiatric: He has a normal mood and affect. His behavior is normal. Judgment and thought content normal.   BP 116/72 mmHg  Pulse 84  Temp(Src) 98.3 F (36.8 C) (Oral)  Resp 18  Ht 5' 10"  (1.778 m)  Wt 235 lb  (106.595 kg)  BMI 33.72 kg/m2  SpO2 98% Wt Readings from Last 3 Encounters:  03/01/15 235 lb (106.595 kg)  06/28/14 220 lb (99.791 kg)  10/28/13 220 lb (99.791 kg)     Lab Results  Component Value Date   WBC 5.6 03/01/2015   HGB 15.3 03/01/2015   HCT 44.5 03/01/2015   PLT 267 03/01/2015   GLUCOSE 94 03/01/2015   CHOL 160 07/24/2013   TRIG 194* 07/24/2013   HDL 37* 07/24/2013   LDLCALC 84 07/24/2013   ALT 41 03/01/2015   AST 23 03/01/2015   NA 138 03/01/2015   K 3.9 03/01/2015   CL 104 03/01/2015   CREATININE 0.98 03/01/2015   BUN 12 03/01/2015   CO2 23 03/01/2015   TSH 1.839 07/24/2013    No results found.   Assessment & Plan:  Plan I have discontinued Mr. Schnitzler amoxicillin-clavulanate, predniSONE, and doxycycline. I am also having him start on omeprazole. Additionally, I am having him maintain his NON FORMULARY, allopurinol, Hydroquinone (ALERA EX), fexofenadine, indomethacin, fluticasone, chlorpheniramine-HYDROcodone, KRILL OIL PO, and LEVITRA.  Meds ordered this encounter  Medications  . omeprazole (PRILOSEC) 20 MG capsule    Sig: Take 1 capsule (20 mg total) by mouth daily.    Dispense:  30 capsule    Refill:  3    Problem List Items Addressed This Visit    RUQ pain    Check [place] Check labs If pain worsens -- go to ER      Relevant Orders   Comp Met (CMET) (Completed)   CBC w/Diff (Completed)    Other Visit Diagnoses    Right lower quadrant abdominal pain    -  Primary    Relevant Orders    US Abdomen Complete    Epigastric pain        Relevant Medications    omeprazole (PRILOSEC) 20 MG capsule       Follow-up: Return if symptoms worsen or fail to improve.  Garnet Koyanagi, DO

## 2015-03-01 NOTE — Assessment & Plan Note (Signed)
Check [place] Check labs If pain worsens -- go to ER

## 2015-03-01 NOTE — Progress Notes (Deleted)
Patient ID: Michael Howe, male   DOB: Jun 15, 1970, 44 y.o.   MRN: [id number]   Subjective:    Patient ID: Michael Howe, male    DOB: 28-Oct-1970, 44 y.o.   MRN: [id number]  No chief complaint on file.   HPI Patient is in today for ***  Past Medical History  Diagnosis Date  . Gout   . Preventative health care 07/14/2012  . Allergic state 02/01/2012  . Allergy   . Dyslipidemia 07/26/2013    Past Surgical History  Procedure Laterality Date  . Tonsillectomy and adenoidectomy  1975    Family History  Problem Relation Age of Onset  . Gout Maternal Grandmother   . Heart disease Maternal Grandmother   . Cancer Maternal Grandmother   . Hyperlipidemia Mother   . Diabetes Mother   . Bowel Disease Mother     IBS  . Heart disease Sister   . Autism Son   . Stroke Paternal Grandmother   . Alcohol abuse Father     Social History   Social History  . Marital Status: Married    Spouse Name: N/A  . Number of Children: N/A  . Years of Education: N/A   Occupational History  . Not on file.   Social History Main Topics  . Smoking status: Never Smoker   . Smokeless tobacco: Not on file  . Alcohol Use: No  . Drug Use: No  . 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 Not on file   Social History Narrative    Outpatient Prescriptions Prior to Visit  Medication Sig Dispense Refill  . allopurinol (ZYLOPRIM) 300 MG tablet Take 1 tablet (300 mg total) by mouth daily. 30 tablet 3  . amoxicillin-clavulanate (AUGMENTIN) 875-125 MG per tablet Take 1 tablet by mouth 2 (two) times daily. 14 tablet 0  . chlorpheniramine-HYDROcodone (TUSSIONEX PENNKINETIC ER) 10-8 MG/5ML LQCR Take 5 mLs by mouth every 12 (twelve) hours as needed for cough (cough). 60 mL 0  . doxycycline (VIBRAMYCIN) 100 MG capsule Take 1 capsule (100 mg total) by mouth 2 (two) times daily. One po bid x 7 days 14 capsule 0  . fexofenadine  (ALLEGRA) 180 MG tablet Take 1 tablet (180 mg total) by mouth daily as needed for allergies or rhinitis.    . fluticasone (FLONASE) 50 MCG/ACT nasal spray Place 2 sprays into both nostrils daily as needed for allergies or rhinitis. 16 g 2  . Hydroquinone (ALERA EX) Apply topically 1 day or 1 dose.    . indomethacin (INDOCIN) 50 MG capsule Take 1 capsule (50 mg total) by mouth 2 (two) times daily as needed for moderate pain. 60 capsule 1  . KRILL OIL PO Take by mouth.    Marland Kitchen LEVITRA 20 MG tablet TAKE ONE TABLET BY MOUTH EVERY DAY AS NEEDED 6 tablet 3  . NON FORMULARY Life way Kefir- drink 8 oz daily    . predniSONE (DELTASONE) 20 MG tablet Take 1 tablet (20 mg total) by mouth daily with breakfast. Take 2 tablets x 3 days, then 1 tablet x 3 days 9 tablet 0   No facility-administered medications prior to visit.    No Known Allergies  Review of Systems  Constitutional: Negative for fever and malaise/fatigue.  HENT: Negative for congestion.   Eyes: Negative for discharge.  Respiratory: Negative for shortness of breath.   Cardiovascular: Negative for chest pain, palpitations  and leg swelling.  Gastrointestinal: Negative for nausea and abdominal pain.  Genitourinary: Negative for dysuria.  Musculoskeletal: Negative for falls.  Skin: Negative for rash.  Neurological: Negative for loss of consciousness and headaches.  Endo/Heme/Allergies: Negative for environmental allergies.  Psychiatric/Behavioral: Negative for depression. The patient is not nervous/anxious.        Objective:    Physical Exam  There were no vitals taken for this visit. Wt Readings from Last 3 Encounters:  06/28/14 220 lb (99.791 kg)  10/28/13 220 lb (99.791 kg)  10/20/13 227 lb 6.4 oz (103.148 kg)     Lab Results  Component Value Date   WBC 5.2 10/28/2013   HGB 15.8 10/28/2013   HCT 44.7 10/28/2013   PLT 266 10/28/2013   GLUCOSE 138* 10/28/2013   CHOL 160 07/24/2013   TRIG 194* 07/24/2013   HDL 37*  07/24/2013   LDLCALC 84 07/24/2013   ALT 44 07/24/2013   AST 25 07/24/2013   NA 139 10/28/2013   K 4.0 10/28/2013   CL 102 10/28/2013   CREATININE 0.90 10/28/2013   BUN 12 10/28/2013   CO2 25 10/28/2013   TSH 1.839 07/24/2013    Lab Results  Component Value Date   TSH 1.839 07/24/2013   Lab Results  Component Value Date   WBC 5.2 10/28/2013   HGB 15.8 10/28/2013   HCT 44.7 10/28/2013   MCV 85.6 10/28/2013   PLT 266 10/28/2013   Lab Results  Component Value Date   NA 139 10/28/2013   K 4.0 10/28/2013   CO2 25 10/28/2013   GLUCOSE 138* 10/28/2013   BUN 12 10/28/2013   CREATININE 0.90 10/28/2013   BILITOT 0.5 07/24/2013   ALKPHOS 41 07/24/2013   AST 25 07/24/2013   ALT 44 07/24/2013   PROT 6.8 07/24/2013   ALBUMIN 4.7 07/24/2013   ALBUMIN 4.7 07/24/2013   CALCIUM 9.5 10/28/2013   Lab Results  Component Value Date   CHOL 160 07/24/2013   Lab Results  Component Value Date   HDL 37* 07/24/2013   Lab Results  Component Value Date   LDLCALC 84 07/24/2013   Lab Results  Component Value Date   TRIG 194* 07/24/2013   Lab Results  Component Value Date   CHOLHDL 4.3 07/24/2013   No results found for: HGBA1C     Assessment & Plan:   Problem List Items Addressed This Visit    None      I am having Mr. Skeels maintain his NON FORMULARY, allopurinol, Hydroquinone (ALERA EX), fexofenadine, indomethacin, fluticasone, amoxicillin-clavulanate, chlorpheniramine-HYDROcodone, predniSONE, KRILL OIL PO, LEVITRA, and doxycycline.  No orders of the defined types were placed in this encounter.     Elizabeth Sauer, LPN

## 2015-03-01 NOTE — Progress Notes (Signed)
Pre visit review using our clinic review tool, if applicable. No additional management support is needed unless otherwise documented below in the visit note. 

## 2015-03-02 ENCOUNTER — Encounter: Payer: Self-pay | Admitting: Family Medicine

## 2015-03-04 ENCOUNTER — Encounter: Payer: Self-pay | Admitting: *Deleted

## 2015-03-04 ENCOUNTER — Ambulatory Visit
Admission: RE | Admit: 2015-03-04 | Discharge: 2015-03-04 | Disposition: A | Discharge status: Home / Self Care | Payer: Managed Care, Other (non HMO) | Source: Ambulatory Visit | Attending: Family Medicine | Admitting: Family Medicine

## 2015-03-04 ENCOUNTER — Telehealth: Payer: Self-pay | Admitting: Family Medicine

## 2015-03-04 DIAGNOSIS — R1031 Right lower quadrant pain: Secondary | ICD-10-CM

## 2015-03-04 NOTE — Telephone Encounter (Signed)
Pt called again stating hasn't been called yet and is wanting to now result information ASAP, since pt states leaving town tomorrow. Please advice.

## 2015-03-04 NOTE — Telephone Encounter (Signed)
Pt would like return call with additional questions from lab & [place] results. Best # [phone].

## 2015-03-04 NOTE — Telephone Encounter (Signed)
I just spoke to this patient and informed him if still having symptoms would need appointment with his PCP Dr. Charlett Blake to followup on [place].  Have forwarded results to PCP.

## 2015-03-04 NOTE — Telephone Encounter (Signed)
Please see result note with Michael Howe.

## 2015-03-05 ENCOUNTER — Other Ambulatory Visit: Payer: Self-pay | Admitting: Family Medicine

## 2015-03-05 ENCOUNTER — Ambulatory Visit (HOSPITAL_BASED_OUTPATIENT_CLINIC_OR_DEPARTMENT_OTHER)
Admission: RE | Admit: 2015-03-05 | Discharge: 2015-03-05 | Disposition: A | Discharge status: Home / Self Care | Payer: Managed Care, Other (non HMO) | Source: Ambulatory Visit | Attending: Family Medicine | Admitting: Family Medicine

## 2015-03-05 ENCOUNTER — Ambulatory Visit (INDEPENDENT_AMBULATORY_CARE_PROVIDER_SITE_OTHER): Payer: Managed Care, Other (non HMO) | Admitting: Family Medicine

## 2015-03-05 ENCOUNTER — Encounter: Payer: Self-pay | Admitting: Family Medicine

## 2015-03-05 VITALS — BP 109/66 | HR 103 | Temp 97.8°F | Ht 70.0 in | Wt 231.4 lb

## 2015-03-05 DIAGNOSIS — N200 Calculus of kidney: Secondary | ICD-10-CM

## 2015-03-05 DIAGNOSIS — K429 Umbilical hernia without obstruction or gangrene: Secondary | ICD-10-CM

## 2015-03-05 DIAGNOSIS — R1031 Right lower quadrant pain: Secondary | ICD-10-CM | POA: Diagnosis not present

## 2015-03-05 DIAGNOSIS — K760 Fatty (change of) liver, not elsewhere classified: Secondary | ICD-10-CM

## 2015-03-05 DIAGNOSIS — R1011 Right upper quadrant pain: Secondary | ICD-10-CM

## 2015-03-05 DIAGNOSIS — E785 Hyperlipidemia, unspecified: Secondary | ICD-10-CM

## 2015-03-05 DIAGNOSIS — M109 Gout, unspecified: Secondary | ICD-10-CM

## 2015-03-05 DIAGNOSIS — Z23 Encounter for immunization: Secondary | ICD-10-CM

## 2015-03-05 DIAGNOSIS — R109 Unspecified abdominal pain: Secondary | ICD-10-CM | POA: Diagnosis present

## 2015-03-05 DIAGNOSIS — R932 Abnormal findings on diagnostic imaging of liver and biliary tract: Secondary | ICD-10-CM | POA: Diagnosis not present

## 2015-03-05 DIAGNOSIS — K8020 Calculus of gallbladder without cholecystitis without obstruction: Secondary | ICD-10-CM

## 2015-03-05 HISTORY — DX: Umbilical hernia without obstruction or gangrene: K42.9

## 2015-03-05 NOTE — Progress Notes (Signed)
Pre visit review using our clinic review tool, if applicable. No additional management support is needed unless otherwise documented below in the visit note. 

## 2015-03-05 NOTE — Patient Instructions (Addendum)
NOW company 10 strain probiotic on line at [email or web address] or [facility] probiotics locally curcumen caps helps inflammation   Fatty Liver Fatty liver is the accumulation of fat in liver cells. It is also called hepatosteatosis or steatohepatitis. It is normal for your liver to contain some fat. If fat is more than 5 to 10% of your liver's weight, you have fatty liver.  There are often no symptoms (problems) for years while damage is still occurring. People often learn about their fatty liver when they have medical tests for other reasons. Fat can damage your liver for years or even decades without causing problems. When it becomes severe, it can cause fatigue, weight loss, weakness, and confusion. This makes you more likely to develop more serious liver problems. The liver is the largest organ in the body. It does a lot of work and often gives no warning signs when it is sick until late in a disease. The liver has many important jobs including:  Breaking down foods.  Storing vitamins, iron, and other minerals.  Making proteins.  Making bile for food digestion.  Breaking down many products including medications, alcohol and some poisons. CAUSES  There are a number of different conditions, medications, and poisons that can cause a fatty liver. Eating too many calories causes fat to build up in the liver. Not processing and breaking fats down normally may also cause this. Certain conditions, such as obesity, diabetes, and high triglycerides also cause this. Most fatty liver patients tend to be middle-aged and over weight.  Some causes of fatty liver are:  Alcohol over consumption.  Malnutrition.  Steroid use.  Valproic acid toxicity.  Obesity.  Cushing's syndrome.  Poisons.  Tetracycline in high dosages.  Pregnancy.  Diabetes.  Hyperlipidemia.  Rapid weight loss. Some people develop fatty liver even having none of these  conditions. SYMPTOMS  Fatty liver most often causes no problems. This is called asymptomatic.  It can be diagnosed with blood tests and also by a liver biopsy.  It is one of the most common causes of minor elevations of liver enzymes on routine blood tests.  Specialized Imaging of the liver using ultrasound, CT (computed tomography) scan, or MRI (magnetic resonance imaging) can suggest a fatty liver but a biopsy is needed to confirm it.  A biopsy involves taking a small sample of liver tissue. This is done by using a needle. It is then looked at under a microscope by a specialist. TREATMENT  It is important to treat the cause. Simple fatty liver without a medical reason may not need treatment.  Weight loss, fat restriction, and exercise in overweight patients produces inconsistent results but is worth trying.  Fatty liver due to alcohol toxicity may not improve even with stopping drinking.  Good control of diabetes may reduce fatty liver.  Lower your triglycerides through diet, medication or both.  Eat a balanced, healthy diet.  Increase your physical activity.  Get regular checkups from a liver specialist.  There are no medical or surgical treatments for a fatty liver or NASH, but improving your diet and increasing your exercise may help prevent or reverse some of the damage. PROGNOSIS  Fatty liver may cause no damage or it can lead to an inflammation of the liver. This is, called steatohepatitis. When it is linked to alcohol abuse, it is called alcoholic steatohepatitis. It often is not linked to alcohol. It is then called nonalcoholic steatohepatitis, or NASH. Over time the liver may become scarred  and hardened. This condition is called cirrhosis. Cirrhosis is serious and may lead to liver failure or cancer. NASH is one of the leading causes of cirrhosis. About 10-20% of Americans have fatty liver and a smaller 2-5% has NASH. Document Released: 07/10/2005 Document Revised:  08/17/2011 Document Reviewed: 10/04/2013 [facility] Patient Information 2015 [place], [place]. This information is not intended to replace advice given to you by your health care provider. Make sure you discuss any questions you have with your health care provider.

## 2015-03-06 ENCOUNTER — Encounter: Payer: Self-pay | Admitting: *Deleted

## 2015-03-06 LAB — COMPREHENSIVE METABOLIC PANEL
ALBUMIN: 4.3 g/dL (ref 3.5–5.2)
ALT: 48 U/L (ref 0–53)
AST: 31 U/L (ref 0–37)
Alkaline Phosphatase: 37 U/L — ABNORMAL LOW (ref 39–117)
BUN: 14 mg/dL (ref 6–23)
CHLORIDE: 104 meq/L (ref 96–112)
CO2: 28 mEq/L (ref 19–32)
Calcium: 9.9 mg/dL (ref 8.4–10.5)
Creatinine, Ser: 1.11 mg/dL (ref 0.40–1.50)
GFR: 75.64 mL/min (ref >60.00)
GLUCOSE: 81 mg/dL (ref 70–99)
Potassium: 4.1 mEq/L (ref 3.5–5.1)
Sodium: 140 mEq/L (ref 135–145)
Total Bilirubin: 0.5 mg/dL (ref 0.2–1.2)
Total Protein: 6.9 g/dL (ref 6.0–8.3)

## 2015-03-06 LAB — CBC
HEMATOCRIT: 44.9 % (ref 39.0–52.0)
Hemoglobin: 15.1 g/dL (ref 13.0–17.0)
MCHC: 33.5 g/dL (ref 30.0–36.0)
MCV: 86.7 fl (ref 78.0–100.0)
Platelets: 271 10*3/uL (ref 150.0–400.0)
RBC: 5.18 Mil/uL (ref 4.22–5.81)
RDW: 14.1 % (ref 11.5–15.5)
WBC: 6 10*3/uL (ref 4.0–10.5)

## 2015-03-06 LAB — HEPATITIS PANEL, ACUTE
HCV Ab: NEGATIVE
HEP A IGM: NONREACTIVE
HEP B S AG: NEGATIVE
Hep B C IgM: NONREACTIVE

## 2015-03-06 LAB — URINALYSIS
Bilirubin Urine: NEGATIVE
HGB URINE DIPSTICK: NEGATIVE
Ketones, ur: NEGATIVE
LEUKOCYTES UA: NEGATIVE
NITRITE: NEGATIVE
SPECIFIC GRAVITY, URINE: 1.015 (ref 1.000–1.030)
Total Protein, Urine: NEGATIVE
URINE GLUCOSE: NEGATIVE
UROBILINOGEN UA: 0.2 (ref 0.0–1.0)
pH: 6 (ref 5.0–8.0)

## 2015-03-06 LAB — TSH: TSH: 1.64 u[IU]/mL (ref 0.35–4.50)

## 2015-03-06 LAB — URIC ACID: Uric Acid, Serum: 7.8 mg/dL (ref 4.0–7.8)

## 2015-03-06 LAB — URINE CULTURE
COLONY COUNT: NO GROWTH
Organism ID, Bacteria: NO GROWTH

## 2015-03-06 LAB — SEDIMENTATION RATE: Sed Rate: 3 mm/hr (ref 0–22)

## 2015-03-07 ENCOUNTER — Encounter: Payer: Self-pay | Admitting: Family Medicine

## 2015-03-07 NOTE — Telephone Encounter (Signed)
Patient has been informed that all results are normal/negative.  He would like to know any other ideas regarding his dry mouth.

## 2015-03-07 NOTE — Telephone Encounter (Signed)
Called left msg. To call back 

## 2015-03-07 NOTE — Telephone Encounter (Signed)
Unfortunately no great ideas, a healthy adult needs 64 oz of fluids daily and if drinks any alcohol or caffeine these are diuretics and will dry you out so have to drink more. Biotene and Spry products help dry mouth. Dr Jacinto Reap

## 2015-03-07 NOTE — Telephone Encounter (Signed)
Patient has a few more questions regarding lab results. Best # [phone]

## 2015-03-08 MED ORDER — ALLOPURINOL 300 MG PO TABS: 300.0000 mg | ORAL_TABLET | Freq: Every day | ORAL | Status: DC

## 2015-03-08 NOTE — Telephone Encounter (Signed)
Patient informed of PCP instructions.  He also requested allopurinol refill which I took care of for him.

## 2015-03-08 NOTE — Addendum Note (Signed)
Addended by: Sharon Seller B on: 03/08/2015 08:13 AM   Modules accepted: Orders

## 2015-03-10 ENCOUNTER — Encounter: Payer: Self-pay | Admitting: Family Medicine

## 2015-03-10 DIAGNOSIS — K8020 Calculus of gallbladder without cholecystitis without obstruction: Secondary | ICD-10-CM

## 2015-03-10 HISTORY — DX: Calculus of gallbladder without cholecystitis without obstruction: K80.20

## 2015-03-10 NOTE — Assessment & Plan Note (Signed)
With right sided abdominal pain, nausea and bloating, referred to gen surgery for consideration. Maintain a bland, lowfat diet and start a probiotic

## 2015-03-10 NOTE — Assessment & Plan Note (Signed)
Encouraged heart healthy diet, increase exercise, avoid trans fats, consider a krill oil cap daily 

## 2015-03-10 NOTE — Progress Notes (Signed)
Subjective:    Patient ID: Michael Howe, male    DOB: 1970-10-17, 44 y.o.   MRN: [id number]  Chief Complaint  Patient presents with  . Results    HPI Patient is in today for evaluation of abdominal pain. Isn't having trouble intermittently with right upper quadrant pain. And the pain gets severe he also has right shoulder plain shortness of breath and even some right hip pain at times. No change in bowel habits. No bloody or tarry stool. No recent change in diet. Does complain of some bloating and nausea but no vomiting or diarrhea. Denies CP/palp/SOB/HA/congestion/feversor GU c/o. Taking meds as prescribed  Past Medical History  Diagnosis Date  . Gout   . Preventative health care 07/14/2012  . Allergic state 02/01/2012  . Allergy   . Dyslipidemia 07/26/2013  . Umbilical hernia 7/90/3833  . Cholelithiasis 03/10/2015    Past Surgical History  Procedure Laterality Date  . Tonsillectomy and adenoidectomy  1975    Family History  Problem Relation Age of Onset  . Gout Maternal Grandmother   . Heart disease Maternal Grandmother   . Cancer Maternal Grandmother   . Hyperlipidemia Mother   . Diabetes Mother   . Bowel Disease Mother     IBS  . Heart disease Sister   . Autism Son   . Stroke Paternal Grandmother   . Alcohol abuse Father     Social History   Social History  . Marital Status: Married    Spouse Name: N/A  . Number of Children: N/A  . Years of Education: N/A   Occupational History  . Not on file.   Social History Main Topics  . Smoking status: Never Smoker   . Smokeless tobacco: Not on file  . Alcohol Use: No  . Drug Use: No  . 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 Not on file   Social History Narrative    Outpatient Prescriptions Prior to Visit  Medication Sig Dispense Refill  . fexofenadine (ALLEGRA) 180 MG tablet Take 1 tablet (180 mg total) by mouth daily as  needed for allergies or rhinitis.    . fluticasone (FLONASE) 50 MCG/ACT nasal spray Place 2 sprays into both nostrils daily as needed for allergies or rhinitis. 16 g 2  . KRILL OIL PO Take by mouth.    Marland Kitchen LEVITRA 20 MG tablet TAKE ONE TABLET BY MOUTH EVERY DAY AS NEEDED 6 tablet 3  . NON FORMULARY Life way Kefir- drink 8 oz daily    . allopurinol (ZYLOPRIM) 300 MG tablet Take 1 tablet (300 mg total) by mouth daily. 30 tablet 3  . indomethacin (INDOCIN) 50 MG capsule Take 1 capsule (50 mg total) by mouth 2 (two) times daily as needed for moderate pain. (Patient not taking: Reported on 03/05/2015) 60 capsule 1  . omeprazole (PRILOSEC) 20 MG capsule Take 1 capsule (20 mg total) by mouth daily. (Patient not taking: Reported on 03/05/2015) 30 capsule 3  . chlorpheniramine-HYDROcodone (TUSSIONEX PENNKINETIC ER) 10-8 MG/5ML LQCR Take 5 mLs by mouth every 12 (twelve) hours as needed for cough (cough). 60 mL 0  . Hydroquinone (ALERA EX) Apply topically 1 day or 1 dose.     No facility-administered medications prior to visit.    No Known Allergies  Review of Systems  Constitutional: Negative for fever and malaise/fatigue.  HENT: Negative for congestion.   Eyes: Negative  for discharge.  Respiratory: Negative for shortness of breath.   Cardiovascular: Negative for chest pain, palpitations and leg swelling.  Gastrointestinal: Positive for heartburn, nausea and abdominal pain. Negative for constipation, blood in stool and melena.  Genitourinary: Negative for dysuria.  Musculoskeletal: Negative for falls.  Skin: Negative for rash.  Neurological: Negative for loss of consciousness and headaches.  Endo/Heme/Allergies: Negative for environmental allergies.  Psychiatric/Behavioral: Negative for depression. The patient is not nervous/anxious.        Objective:    Physical Exam  Constitutional: He is oriented to person, place, and time. He appears well-developed and well-nourished. No distress.  HENT:    Head: Normocephalic and atraumatic.  Nose: Nose normal.  Eyes: Right eye exhibits no discharge. Left eye exhibits no discharge.  Neck: Normal range of motion. Neck supple.  Cardiovascular: Normal rate and regular rhythm.   No murmur heard. Pulmonary/Chest: Effort normal and breath sounds normal.  Abdominal: Soft. Bowel sounds are normal. There is no tenderness.  Musculoskeletal: He exhibits no edema.  Neurological: He is alert and oriented to person, place, and time.  Skin: Skin is warm and dry.  Psychiatric: He has a normal mood and affect.  Nursing note and vitals reviewed.   BP 109/66 mmHg  Pulse 103  Temp(Src) 97.8 F (36.6 C) (Oral)  Ht _0  (1.778 m)  Wt 231 lb 6 oz (104.951 kg)  BMI 33.20 kg/m2  SpO2 97% Wt Readings from Last 3 Encounters:  03/05/15 231 lb 6 oz (104.951 kg)  03/01/15 235 lb (106.595 kg)  06/28/14 220 lb (99.791 kg)     Lab Results  Component Value Date   WBC 6.0 03/05/2015   HGB 15.1 03/05/2015   HCT 44.9 03/05/2015   PLT 271.0 03/05/2015   GLUCOSE 81 03/05/2015   CHOL 160 07/24/2013   TRIG 194* 07/24/2013   HDL 37* 07/24/2013   LDLCALC 84 07/24/2013   ALT 48 03/05/2015   AST 31 03/05/2015   NA 140 03/05/2015   K 4.1 03/05/2015   CL 104 03/05/2015   CREATININE 1.11 03/05/2015   BUN 14 03/05/2015   CO2 28 03/05/2015   TSH 1.64 03/05/2015    Lab Results  Component Value Date   TSH 1.64 03/05/2015   Lab Results  Component Value Date   WBC 6.0 03/05/2015   HGB 15.1 03/05/2015   HCT 44.9 03/05/2015   MCV 86.7 03/05/2015   PLT 271.0 03/05/2015   Lab Results  Component Value Date   NA 140 03/05/2015   K 4.1 03/05/2015   CO2 28 03/05/2015   GLUCOSE 81 03/05/2015   BUN 14 03/05/2015   CREATININE 1.11 03/05/2015   BILITOT 0.5 03/05/2015   ALKPHOS 37* 03/05/2015   AST 31 03/05/2015   ALT 48 03/05/2015   PROT 6.9 03/05/2015   ALBUMIN 4.3 03/05/2015   CALCIUM 9.9 03/05/2015   GFR 75.64 03/05/2015   Lab Results  Component  Value Date   CHOL 160 07/24/2013   Lab Results  Component Value Date   HDL 37* 07/24/2013   Lab Results  Component Value Date   LDLCALC 84 07/24/2013   Lab Results  Component Value Date   TRIG 194* 07/24/2013   Lab Results  Component Value Date   CHOLHDL 4.3 07/24/2013   No results found for: HGBA1C     Assessment & Plan:   Problem List Items Addressed This Visit    Umbilical hernia   Relevant Orders   CT Abdomen Pelvis W  Contrast   TSH (Completed)   Comp Met (CMET) (Completed)   CBC (Completed)   Hepatitis, Acute (Completed)   Sed Rate (ESR) (Completed)   Uric acid (Completed)   Urinalysis (Completed)   Urine culture (Completed)   RUQ pain   Relevant Orders   CT Abdomen Pelvis W Contrast   TSH (Completed)   Comp Met (CMET) (Completed)   CBC (Completed)   Hepatitis, Acute (Completed)   Sed Rate (ESR) (Completed)   Uric acid (Completed)   Urinalysis (Completed)   Urine culture (Completed)   Gout   Relevant Orders   CT Abdomen Pelvis W Contrast   TSH (Completed)   Comp Met (CMET) (Completed)   CBC (Completed)   Hepatitis, Acute (Completed)   Sed Rate (ESR) (Completed)   Uric acid (Completed)   Urinalysis (Completed)   Urine culture (Completed)   Dyslipidemia    Encouraged heart healthy diet, increase exercise, avoid trans fats, consider a krill oil cap daily      Relevant Orders   CT Abdomen Pelvis W Contrast   TSH (Completed)   Comp Met (CMET) (Completed)   CBC (Completed)   Hepatitis, Acute (Completed)   Sed Rate (ESR) (Completed)   Uric acid (Completed)   Urinalysis (Completed)   Urine culture (Completed)   Cholelithiasis    With right sided abdominal pain, nausea and bloating, referred to gen surgery for consideration. Maintain a bland, lowfat diet and start a probiotic       Other Visit Diagnoses    Encounter for immunization    -  Primary    Renal lithiasis        Relevant Orders    CT Abdomen Pelvis W Contrast    TSH (Completed)      Comp Met (CMET) (Completed)    CBC (Completed)    Hepatitis, Acute (Completed)    Sed Rate (ESR) (Completed)    Uric acid (Completed)    Urinalysis (Completed)    Urine culture (Completed)    RLQ abdominal pain        Relevant Orders    CT Abdomen Pelvis W Contrast    TSH (Completed)    Comp Met (CMET) (Completed)    CBC (Completed)    Hepatitis, Acute (Completed)    Sed Rate (ESR) (Completed)    Uric acid (Completed)    Urinalysis (Completed)    Urine culture (Completed)    Fatty liver        Relevant Orders    CT Abdomen Pelvis W Contrast    TSH (Completed)    Comp Met (CMET) (Completed)    CBC (Completed)    Hepatitis, Acute (Completed)    Sed Rate (ESR) (Completed)    Uric acid (Completed)    Urinalysis (Completed)    Urine culture (Completed)       I have discontinued Mr. Colarusso Hydroquinone (ALERA EX) and chlorpheniramine-HYDROcodone. I am also having him maintain his NON FORMULARY, fexofenadine, indomethacin, fluticasone, KRILL OIL PO, LEVITRA, and omeprazole.  No orders of the defined types were placed in this encounter.     Penni Homans, MD

## 2015-10-15 ENCOUNTER — Telehealth: Payer: Self-pay | Admitting: Family Medicine

## 2015-10-15 MED ORDER — ALLOPURINOL 300 MG PO TABS: 300.0000 mg | ORAL_TABLET | Freq: Every day | ORAL | Status: DC

## 2015-10-15 NOTE — Telephone Encounter (Signed)
Can be reached: [phone] Pharmacy: [id number] - [place], [address] [place]  Reason for call: pt out of town and forgot allopurinol. Please send in 30 day supply for him to above pharmacy in [place]. Pt request call to let him know it is sent.

## 2015-10-15 NOTE — Telephone Encounter (Signed)
Sent in as requested and patient informed by message.

## 2015-10-27 ENCOUNTER — Emergency Department (HOSPITAL_BASED_OUTPATIENT_CLINIC_OR_DEPARTMENT_OTHER): Payer: Managed Care, Other (non HMO)

## 2015-10-27 ENCOUNTER — Emergency Department (HOSPITAL_BASED_OUTPATIENT_CLINIC_OR_DEPARTMENT_OTHER)
Admission: EM | Admit: 2015-10-27 | Discharge: 2015-10-27 | Disposition: A | Discharge status: Home / Self Care | Payer: Managed Care, Other (non HMO) | Attending: Emergency Medicine | Admitting: Emergency Medicine

## 2015-10-27 ENCOUNTER — Encounter (HOSPITAL_BASED_OUTPATIENT_CLINIC_OR_DEPARTMENT_OTHER): Payer: Self-pay | Admitting: *Deleted

## 2015-10-27 DIAGNOSIS — R109 Unspecified abdominal pain: Secondary | ICD-10-CM | POA: Diagnosis present

## 2015-10-27 DIAGNOSIS — M545 Low back pain: Secondary | ICD-10-CM

## 2015-10-27 DIAGNOSIS — N200 Calculus of kidney: Secondary | ICD-10-CM | POA: Insufficient documentation

## 2015-10-27 LAB — URINALYSIS, ROUTINE W REFLEX MICROSCOPIC
BILIRUBIN URINE: NEGATIVE
GLUCOSE, UA: NEGATIVE mg/dL
Hgb urine dipstick: NEGATIVE
KETONES UR: NEGATIVE mg/dL
LEUKOCYTES UA: NEGATIVE
Nitrite: NEGATIVE
PH: 5.5 (ref 5.0–8.0)
Protein, ur: NEGATIVE mg/dL
SPECIFIC GRAVITY, URINE: 1.01 (ref 1.005–1.030)

## 2015-10-27 MED ORDER — NAPROXEN 500 MG PO TABS: 500.0000 mg | ORAL_TABLET | Freq: Two times a day (BID) | ORAL | Status: DC

## 2015-10-27 MED ORDER — HYDROCODONE-ACETAMINOPHEN 5-325 MG PO TABS
1.0000 | ORAL_TABLET | Freq: Once | ORAL | Status: AC
  Administered 2015-10-27: 1 via ORAL
  Filled 2015-10-27: qty 1

## 2015-10-27 MED ORDER — METHOCARBAMOL 500 MG PO TABS: 500.0000 mg | ORAL_TABLET | Freq: Two times a day (BID) | ORAL | Status: DC | PRN

## 2015-10-27 NOTE — ED Provider Notes (Signed)
CSN: [id number]     Arrival date & time 10/27/15  1136 History   First MD Initiated Contact with Patient 10/27/15 1308     Chief Complaint  Patient presents with  . Flank Pain    (Consider location/radiation/quality/duration/timing/severity/associated sxs/prior Treatment) Patient is a 45 y.o. male presenting with flank pain. The history is provided by the patient and medical records. No language interpreter was used.  Flank Pain Pertinent negatives include no abdominal pain, chills, congestion, coughing, fever, headaches, nausea, neck pain, vomiting or weakness.  Michael Howe is a 45 y.o. male  with a PMH of cholelithiasis, kidney stones, gout who presents to the Emergency Department complaining of intermittent sharp non-radiating right low back pain x 2 weeks. Ibuprofen and aleve taken with little relief. Hx of kidney stones in the past. Denies abdominal pain, n/v, fever, chest pain, dysuria. Patient states he run 3 times a week - there has been no new increase in activity, but he has continued his usual work out regimen throughout the last two weeks.    Past Medical History  Diagnosis Date  . Gout   . Preventative health care 07/14/2012  . Allergic state 02/01/2012  . Allergy   . Dyslipidemia 07/26/2013  . Umbilical hernia [id number]  . Cholelithiasis 03/10/2015   Past Surgical History  Procedure Laterality Date  . Tonsillectomy and adenoidectomy  1975   Family History  Problem Relation Age of Onset  . Gout Maternal Grandmother   . Heart disease Maternal Grandmother   . Cancer Maternal Grandmother   . Hyperlipidemia Mother   . Diabetes Mother   . Bowel Disease Mother     IBS  . Heart disease Sister   . Autism Son   . Stroke Paternal Grandmother   . Alcohol abuse Father    Social History  Substance Use Topics  . Smoking status: Never Smoker   . Smokeless tobacco: None  . Alcohol Use: No    Review of Systems  Constitutional: Negative for fever and chills.  HENT:  Negative for congestion.   Eyes: Negative for visual disturbance.  Respiratory: Negative for cough and shortness of breath.   Cardiovascular: Negative.   Gastrointestinal: Negative for nausea, vomiting and abdominal pain.  Genitourinary: Positive for flank pain. Negative for dysuria.  Musculoskeletal: Positive for back pain. Negative for neck pain.  Skin: Negative for color change.  Neurological: Negative for weakness and headaches.      Allergies  Review of patient's allergies indicates no known allergies.  Home Medications   Prior to Admission medications   Medication Sig Start Date End Date Taking? Authorizing Provider  allopurinol (ZYLOPRIM) 300 MG tablet Take 1 tablet (300 mg total) by mouth daily. 10/15/15   Mosie Lukes, MD  fexofenadine (ALLEGRA) 180 MG tablet Take 1 tablet (180 mg total) by mouth daily as needed for allergies or rhinitis. 07/24/13   Mosie Lukes, MD  fluticasone (FLONASE) 50 MCG/ACT nasal spray Place 2 sprays into both nostrils daily as needed for allergies or rhinitis. 07/26/13   Mosie Lukes, MD  indomethacin (INDOCIN) 50 MG capsule Take 1 capsule (50 mg total) by mouth 2 (two) times daily as needed for moderate pain. Patient not taking: Reported on 03/05/2015 07/24/13   Mosie Lukes, MD  KRILL OIL PO Take by mouth.    Historical Provider, MD  LEVITRA 20 MG tablet TAKE ONE TABLET BY MOUTH EVERY DAY AS NEEDED 05/02/14   Mosie Lukes, MD  methocarbamol (ROBAXIN) 500 MG  tablet Take 1 tablet (500 mg total) by mouth 2 (two) times daily as needed for muscle spasms. 10/27/15   Ozella Almond Lazara Grieser, PA-C  naproxen (NAPROSYN) 500 MG tablet Take 1 tablet (500 mg total) by mouth 2 (two) times daily. 10/27/15   Ozella Almond Cale Decarolis, PA-C  NON FORMULARY Life way Kefir- drink 8 oz daily    Historical Provider, MD  omeprazole (PRILOSEC) 20 MG capsule Take 1 capsule (20 mg total) by mouth daily. Patient not taking: Reported on 03/05/2015 03/01/15   Alferd Apa Lowne Chase, DO    BP 102/71 mmHg  Pulse 55  Temp(Src) 97.6 F (36.4 C) (Oral)  Resp 16  SpO2 98% Physical Exam  Constitutional: He is oriented to person, place, and time. He appears well-developed and well-nourished.  Alert and in no acute distress  HENT:  Head: Normocephalic and atraumatic.  Cardiovascular: Normal rate, regular rhythm, normal heart sounds and intact distal pulses.  Exam reveals no gallop and no friction rub.   No murmur heard. Pulmonary/Chest: Effort normal and breath sounds normal. No respiratory distress. He has no wheezes. He has no rales. He exhibits no tenderness.  Abdominal: Soft. Bowel sounds are normal. He exhibits no distension and no mass. There is no tenderness. There is no rebound and no guarding.  Musculoskeletal:       Back:  TTP as depicted in image. No erythema, ecchymosis, swelling noted. No midline tenderness.  Neurological: He is alert and oriented to person, place, and time.  Skin: Skin is warm and dry.  Nursing note and vitals reviewed.   ED Course  Procedures (including critical care time) Labs Review Labs Reviewed  URINALYSIS, ROUTINE W REFLEX MICROSCOPIC (NOT AT [facility])    Imaging Review US Renal  10/27/2015  CLINICAL DATA:  Right flank pain for 2 weeks. EXAM: RENAL / URINARY TRACT ULTRASOUND COMPLETE COMPARISON:  03/05/2015 FINDINGS: Right Kidney: Length: 11.5 cm. Echogenicity within normal limits. No mass or hydronephrosis visualized. Right kidney cluster of shadowing hyperechogenicity 1.1 cm in diameter compatible with stone or clustered stones. Left Kidney: Length: 12.2 cm. Echogenicity within normal limits. No mass or hydronephrosis visualized. Bladder: Appears normal for degree of bladder distention. Bilateral ureteral jets seen. IMPRESSION: 1. Nonobstructive right kidney upper pole nephrolithiasis. Otherwise, no significant abnormalities are observed. Electronically Signed   By: Van Clines M.D.   On: 10/27/2015 16:32   I have personally  reviewed and evaluated these images and lab results as part of my medical decision-making.   EKG Interpretation None      MDM   Final diagnoses:  Right low back pain, with sciatica presence unspecified  Nephrolithiasis   Michael Howe presents to ED for right low back pain x 2 weeks. Hx of kidney stones in the past, but typically pain does not last this long. On exam, there is tenderness to palpation of the right low back. Musk vs. Stone. UA wdl. Renal ultrasound shows nonobstructive stone in right upper pole. No other abnormalities noted. Patient re-evaluated prior to discharge and pain well controlled.   Plan: Naproxen, increase hydration, robaxin PRN muscle aches/spasms, PCP follow up. Return precautions given and all questions answered.   Patient seen by and discussed with Dr. Jeneen Rinks who agrees with treatment plan.   [facility] Michael Turnbo, PA-C 10/27/15 1646  Tanna Furry, MD 11/09/15 [phone]

## 2015-10-27 NOTE — ED Notes (Signed)
PA at bedside.

## 2015-10-27 NOTE — ED Notes (Signed)
MD at bedside. 

## 2015-10-27 NOTE — ED Notes (Addendum)
Intermittent R flank pain x 2 weeks, denies N/V, denies abd pain. Denies urinary burning, states he has increased urinary frequency. Describes pain as sharp and nonradiating. Pt taking ibuprofen and aleve without relief.

## 2015-10-27 NOTE — Discharge Instructions (Signed)
Increase hydration. Take naproxen as needed for pain. Robaxin as needed for muscle spasms/aches - This can make you very drowsy - please do not drink or drive on this medication Follow up with your primary physician in 1 week if symptoms persist. Return to ER for new or worsening symptoms, any additional concerns.

## 2015-10-27 NOTE — ED Notes (Signed)
Patient c/o R flank pain that has been persistent over the past two weeks. Hurts more when he tries to sit or is immobile.

## 2015-10-27 NOTE — ED Provider Notes (Signed)
Patient seen and evaluated. Complains of pain for several weeks. Somewhat reproducible on exam. No blood in his urine. Agree with ultrasound to rule out passing stone as patient has history of known kidney stones that his never had stone passage in the past. If negative would recommend treatment for muscle skeletal back pain.  Tanna Furry, MD 10/27/15 [phone]

## 2015-11-01 ENCOUNTER — Encounter: Payer: Self-pay | Admitting: Family Medicine

## 2015-11-01 ENCOUNTER — Ambulatory Visit (INDEPENDENT_AMBULATORY_CARE_PROVIDER_SITE_OTHER): Payer: Managed Care, Other (non HMO) | Admitting: Family Medicine

## 2015-11-01 VITALS — BP 108/70 | HR 54 | Temp 98.2°F | Ht 70.0 in | Wt 220.1 lb

## 2015-11-01 DIAGNOSIS — R109 Unspecified abdominal pain: Secondary | ICD-10-CM

## 2015-11-01 DIAGNOSIS — M109 Gout, unspecified: Secondary | ICD-10-CM

## 2015-11-01 DIAGNOSIS — M533 Sacrococcygeal disorders, not elsewhere classified: Secondary | ICD-10-CM

## 2015-11-01 DIAGNOSIS — N200 Calculus of kidney: Secondary | ICD-10-CM

## 2015-11-01 LAB — COMPREHENSIVE METABOLIC PANEL
ALBUMIN: 4.5 g/dL (ref 3.5–5.2)
ALT: 34 U/L (ref 0–53)
AST: 21 U/L (ref 0–37)
Alkaline Phosphatase: 36 U/L — ABNORMAL LOW (ref 39–117)
BUN: 13 mg/dL (ref 6–23)
CALCIUM: 10 mg/dL (ref 8.4–10.5)
CHLORIDE: 103 meq/L (ref 96–112)
CO2: 28 meq/L (ref 19–32)
Creatinine, Ser: 0.93 mg/dL (ref 0.40–1.50)
GFR: 92.51 mL/min (ref >60.00)
Glucose, Bld: 83 mg/dL (ref 70–99)
POTASSIUM: 3.8 meq/L (ref 3.5–5.1)
SODIUM: 138 meq/L (ref 135–145)
Total Bilirubin: 0.4 mg/dL (ref 0.2–1.2)
Total Protein: 6.9 g/dL (ref 6.0–8.3)

## 2015-11-01 LAB — CBC
HEMATOCRIT: 46.6 % (ref 39.0–52.0)
Hemoglobin: 15.5 g/dL (ref 13.0–17.0)
MCHC: 33.3 g/dL (ref 30.0–36.0)
MCV: 86.5 fl (ref 78.0–100.0)
Platelets: 292 10*3/uL (ref 150.0–400.0)
RBC: 5.39 Mil/uL (ref 4.22–5.81)
RDW: 14.3 % (ref 11.5–15.5)
WBC: 6.2 10*3/uL (ref 4.0–10.5)

## 2015-11-01 LAB — URIC ACID: URIC ACID, SERUM: 7.1 mg/dL (ref 4.0–7.8)

## 2015-11-01 NOTE — Progress Notes (Signed)
Pre visit review using our clinic review tool, if applicable. No additional management support is needed unless otherwise documented below in the visit note. 

## 2015-11-01 NOTE — Patient Instructions (Addendum)
Encouraged moist heat and gentle stretching as tolerated. May try NSAIDs and prescription meds as directed and report if symptoms worsen or seek immediate care. Salon Pas or Aspercreme, NSAID patches or Lidocaine.      Back Pain, Adult Back pain is very common in adults.The cause of back pain is rarely dangerous and the pain often gets better over time.The cause of your back pain may not be known. Some common causes of back pain include:  Strain of the muscles or ligaments supporting the spine.  Wear and tear (degeneration) of the spinal disks.  Arthritis.  Direct injury to the back. For many people, back pain may return. Since back pain is rarely dangerous, most people can learn to manage this condition on their own. HOME CARE INSTRUCTIONS Watch your back pain for any changes. The following actions may help to lessen any discomfort you are feeling:  Remain active. It is stressful on your back to sit or stand in one place for long periods of time. Do not sit, drive, or stand in one place for more than 30 minutes at a time. Take short walks on even surfaces as soon as you are able.Try to increase the length of time you walk each day.  Exercise regularly as directed by your health care provider. Exercise helps your back heal faster. It also helps avoid future injury by keeping your muscles strong and flexible.  Do not stay in bed.Resting more than 1-2 days can delay your recovery.  Pay attention to your body when you bend and lift. The most comfortable positions are those that put less stress on your recovering back. Always use proper lifting techniques, including:  Bending your knees.  Keeping the load close to your body.  Avoiding twisting.  Find a comfortable position to sleep. Use a firm mattress and lie on your side with your knees slightly bent. If you lie on your back, put a pillow under your knees.  Avoid feeling anxious or stressed.Stress increases muscle tension and  can worsen back pain.It is important to recognize when you are anxious or stressed and learn ways to manage it, such as with exercise.  Take medicines only as directed by your health care provider. Over-the-counter medicines to reduce pain and inflammation are often the most helpful.Your health care provider may prescribe muscle relaxant drugs.These medicines help dull your pain so you can more quickly return to your normal activities and healthy exercise.  Apply ice to the injured area:  Put ice in a plastic bag.  Place a towel between your skin and the bag.  Leave the ice on for 20 minutes, 2-3 times a day for the first 2-3 days. After that, ice and heat may be alternated to reduce pain and spasms.  Maintain a healthy weight. Excess weight puts extra stress on your back and makes it difficult to maintain good posture. SEEK MEDICAL CARE IF:  You have pain that is not relieved with rest or medicine.  You have increasing pain going down into the legs or buttocks.  You have pain that does not improve in one week.  You have night pain.  You lose weight.  You have a fever or chills. SEEK IMMEDIATE MEDICAL CARE IF:   You develop new bowel or bladder control problems. You have unusual weakness or numbness in your arms or legs.Sacroiliac Joint Dysfunction Sacroiliac joint dysfunction is a condition that causes inflammation on one or both sides of the sacroiliac (SI) joint. The SI joint connects the  lower part of the spine (sacrum) with the two upper portions of the pelvis (ilium). This condition causes deep aching or burning pain in the low back. In some cases, the pain may also spread into one or both buttocks or hips or spread down the legs. CAUSES This condition may be caused by:  Pregnancy. During pregnancy, extra stress is put on the SI joints because the pelvis widens.  Injury, such as:  Car accidents.  Sport-related injuries.  Work-related injuries.  Having one leg that  is shorter than the other.  Conditions that affect the joints, such as:  Rheumatoid arthritis.  Gout.  Psoriatic arthritis.  Joint infection (septic arthritis). Sometimes, the cause of SI joint dysfunction is not known. SYMPTOMS Symptoms of this condition include:  Aching or burning pain in the lower back. The pain may also spread to other areas, such as:  Buttocks.  Groin.  Thighs and legs.  Muscle spasms in or around the painful areas.  Increased pain when standing, walking, running, stair climbing, bending, or lifting. DIAGNOSIS Your health care provider will do a physical exam and take your medical history. During the exam, the health care provider may move one or both of your legs to different positions to check for pain. Various tests may be done to help verify the diagnosis, including:  Imaging tests to look for other causes of pain. These may include:  MRI.  CT scan.  Bone scan.  Diagnostic injection. A numbing medicine is injected into the SI joint using a needle. If the pain is temporarily improved or stopped after the injection, this can indicate that SI joint dysfunction is the problem. TREATMENT Treatment may vary depending on the cause and severity of your condition. Treatment options may include:  Applying ice or heat to the lower back area. This can help to reduce pain and muscle spasms.  Medicines to relieve pain or inflammation or to relax the muscles.  Wearing a back brace (sacroiliac brace) to help support the joint while your back is healing.  Physical therapy to increase muscle strength around the joint and flexibility at the joint. This may also involve learning proper body positions and ways of moving to relieve stress on the joint.  Direct manipulation of the SI joint.  Injections of steroid medicine into the joint in order to reduce pain and swelling.  Radiofrequency ablation to burn away nerves that are carrying pain messages from the  joint.  Use of a device that provides electrical stimulation in order to reduce pain at the joint.  Surgery to put in screws and plates that limit or prevent joint motion. This is rare. HOME CARE INSTRUCTIONS  Rest as needed. Limit your activities as directed by your health care provider.  Take medicines only as directed by your health care provider.  If directed, apply ice to the affected area:  Put ice in a plastic bag.  Place a towel between your skin and the bag.  Leave the ice on for 20 minutes, 2-3 times per day.  Use a heating pad or a moist heat pack as directed by your health care provider.  Exercise as directed by your health care provider or physical therapist.  Keep all follow-up visits as directed by your health care provider. This is important. SEEK MEDICAL CARE IF:  Your pain is not controlled with medicine.  You have a fever.  You have increasingly severe pain. SEEK IMMEDIATE MEDICAL CARE IF:  You have weakness, numbness, or tingling in  your legs or feet.  You lose control of your bladder or bowel.   This information is not intended to replace advice given to you by your health care provider. Make sure you discuss any questions you have with your health care provider.   Document Released: 08/21/2008 Document Revised: 10/09/2014 Document Reviewed: 01/30/2014 Elsevier Interactive Patient Education 2016 [place] develop nausea or vomiting.  You develop abdominal pain.  You feel faint.   This information is not intended to replace advice given to you by your health care provider. Make sure you discuss any questions you have with your health care provider.   Document Released: 05/25/2005 Document Revised: 06/15/2014 Document Reviewed: 09/26/2013 Elsevier Interactive Patient Education [employer].

## 2015-11-10 ENCOUNTER — Encounter: Payer: Self-pay | Admitting: Family Medicine

## 2015-11-10 DIAGNOSIS — N200 Calculus of kidney: Secondary | ICD-10-CM

## 2015-11-10 DIAGNOSIS — M533 Sacrococcygeal disorders, not elsewhere classified: Secondary | ICD-10-CM | POA: Insufficient documentation

## 2015-11-10 HISTORY — DX: Calculus of kidney: N20.0

## 2015-11-10 NOTE — Progress Notes (Signed)
Patient ID: Michael Howe, male   DOB: September 10, 1970, 45 y.o.   MRN: [id number]   Subjective:    Patient ID: Michael Howe, male    DOB: Sep 27, 1970, 45 y.o.   MRN: [id number]  Chief Complaint  Patient presents with  . Hospitalization Follow-up    HPI Patient is in today for ER follow up. He presented to the emergency room recently with abdominal and flank pain and was diagnosed with kidney stones. He has been feeling better since returning home but has been left with right posterior hip pain which is worse with position changes. No dysuria or fever. Denies CP/palp/SOB/HA/congestion/fevers/GI or GU c/o. Taking meds as prescribed  Past Medical History  Diagnosis Date  . Gout   . Preventative health care 07/14/2012  . Allergic state 02/01/2012  . Allergy   . Dyslipidemia 07/26/2013  . Umbilical hernia [id number]  . Cholelithiasis 03/10/2015  . Renal lithiasis 11/10/2015    Past Surgical History  Procedure Laterality Date  . Tonsillectomy and adenoidectomy  1975    Family History  Problem Relation Age of Onset  . Gout Maternal Grandmother   . Heart disease Maternal Grandmother   . Cancer Maternal Grandmother   . Hyperlipidemia Mother   . Diabetes Mother   . Bowel Disease Mother     IBS  . Heart disease Sister   . Autism Son   . Stroke Paternal Grandmother   . Alcohol abuse Father     Social History   Social History  . Marital Status: Married    Spouse Name: N/A  . Number of Children: N/A  . Years of Education: N/A   Occupational History  . Not on file.   Social History Main Topics  . Smoking status: Never Smoker   . Smokeless tobacco: Not on file  . Alcohol Use: No  . Drug Use: No  . 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 Not on file   Social History Narrative    Outpatient Prescriptions Prior to Visit  Medication Sig Dispense Refill  . allopurinol (ZYLOPRIM) 300 MG tablet Take  1 tablet (300 mg total) by mouth daily. 30 tablet 0  . fexofenadine (ALLEGRA) 180 MG tablet Take 1 tablet (180 mg total) by mouth daily as needed for allergies or rhinitis.    . fluticasone (FLONASE) 50 MCG/ACT nasal spray Place 2 sprays into both nostrils daily as needed for allergies or rhinitis. 16 g 2  . indomethacin (INDOCIN) 50 MG capsule Take 1 capsule (50 mg total) by mouth 2 (two) times daily as needed for moderate pain. (Patient not taking: Reported on 03/05/2015) 60 capsule 1  . KRILL OIL PO Take by mouth.    Marland Kitchen LEVITRA 20 MG tablet TAKE ONE TABLET BY MOUTH EVERY DAY AS NEEDED 6 tablet 3  . methocarbamol (ROBAXIN) 500 MG tablet Take 1 tablet (500 mg total) by mouth 2 (two) times daily as needed for muscle spasms. 5 tablet 0  . naproxen (NAPROSYN) 500 MG tablet Take 1 tablet (500 mg total) by mouth 2 (two) times daily. 30 tablet 0  . NON FORMULARY Life way Kefir- drink 8 oz daily    . omeprazole (PRILOSEC) 20 MG capsule Take 1 capsule (20 mg total) by mouth daily. (Patient not taking: Reported on 03/05/2015) 30 capsule 3   No facility-administered medications prior to visit.    No Known Allergies  Review  of Systems  Constitutional: Negative for fever and malaise/fatigue.  HENT: Negative for congestion.   Eyes: Negative for blurred vision.  Respiratory: Negative for shortness of breath.   Cardiovascular: Negative for chest pain, palpitations and leg swelling.  Gastrointestinal: Negative for nausea, abdominal pain and blood in stool.  Genitourinary: Negative for dysuria and frequency.  Musculoskeletal: Positive for myalgias and back pain. Negative for falls.  Skin: Negative for rash.  Neurological: Negative for dizziness, loss of consciousness and headaches.  Endo/Heme/Allergies: Negative for environmental allergies.  Psychiatric/Behavioral: Negative for depression. The patient is not nervous/anxious.        Objective:    Physical Exam  Constitutional: He is oriented to  person, place, and time. He appears well-developed and well-nourished. No distress.  HENT:  Head: Normocephalic and atraumatic.  Nose: Nose normal.  Eyes: Right eye exhibits no discharge. Left eye exhibits no discharge.  Neck: Normal range of motion. Neck supple.  Cardiovascular: Normal rate and regular rhythm.   No murmur heard. Pulmonary/Chest: Effort normal and breath sounds normal.  Abdominal: Soft. Bowel sounds are normal. There is no tenderness.  Musculoskeletal: He exhibits no edema.  Neurological: He is alert and oriented to person, place, and time.  Skin: Skin is warm and dry.  Psychiatric: He has a normal mood and affect.  Nursing note and vitals reviewed.   BP 108/70 mmHg  Pulse 54  Temp(Src) 98.2 F (36.8 C) (Oral)  Ht 5\' 10"  (1.778 m)  Wt 220 lb 2 oz (99.848 kg)  BMI 31.58 kg/m2  SpO2 97% Wt Readings from Last 3 Encounters:  11/01/15 220 lb 2 oz (99.848 kg)  03/05/15 231 lb 6 oz (104.951 kg)  03/01/15 235 lb (106.595 kg)     Lab Results  Component Value Date   WBC 6.2 11/01/2015   HGB 15.5 11/01/2015   HCT 46.6 11/01/2015   PLT 292.0 11/01/2015   GLUCOSE 83 11/01/2015   CHOL 160 07/24/2013   TRIG 194* 07/24/2013   HDL 37* 07/24/2013   LDLCALC 84 07/24/2013   ALT 34 11/01/2015   AST 21 11/01/2015   NA 138 11/01/2015   K 3.8 11/01/2015   CL 103 11/01/2015   CREATININE 0.93 11/01/2015   BUN 13 11/01/2015   CO2 28 11/01/2015   TSH 1.64 03/05/2015    Lab Results  Component Value Date   TSH 1.64 03/05/2015   Lab Results  Component Value Date   WBC 6.2 11/01/2015   HGB 15.5 11/01/2015   HCT 46.6 11/01/2015   MCV 86.5 11/01/2015   PLT 292.0 11/01/2015   Lab Results  Component Value Date   NA 138 11/01/2015   K 3.8 11/01/2015   CO2 28 11/01/2015   GLUCOSE 83 11/01/2015   BUN 13 11/01/2015   CREATININE 0.93 11/01/2015   BILITOT 0.4 11/01/2015   ALKPHOS 36* 11/01/2015   AST 21 11/01/2015   ALT 34 11/01/2015   PROT 6.9 11/01/2015    ALBUMIN 4.5 11/01/2015   CALCIUM 10.0 11/01/2015   GFR 92.51 11/01/2015   Lab Results  Component Value Date   CHOL 160 07/24/2013   Lab Results  Component Value Date   HDL 37* 07/24/2013   Lab Results  Component Value Date   LDLCALC 84 07/24/2013   Lab Results  Component Value Date   TRIG 194* 07/24/2013   Lab Results  Component Value Date   CHOLHDL 4.3 07/24/2013   No results found for: HGBA1C     Assessment & Plan:  Problem List Items Addressed This Visit    Sacroiliac dysfunction - Primary    Encouraged moist heat and gentle stretching as tolerated. May try NSAIDs and prescription meds as directed and report if symptoms worsen or seek immediate care. Add topical treatments      Relevant Orders   Ambulatory referral to Sports Medicine   Uric acid (Completed)   Renal lithiasis    Is feeling better today after a trip to the ER. Encouraged to maintain adequate hydration and increase citrus intake.       Gout   Relevant Orders   Uric acid (Completed)    Other Visit Diagnoses    Kidney stones        Relevant Orders    CBC (Completed)    Comprehensive metabolic panel (Completed)    Uric acid (Completed)    Right flank pain        Relevant Orders    CBC (Completed)    Comprehensive metabolic panel (Completed)    Uric acid (Completed)       I am having Mr. Levitz maintain his NON FORMULARY, fexofenadine, indomethacin, fluticasone, KRILL OIL PO, LEVITRA, omeprazole, allopurinol, naproxen, and methocarbamol.  No orders of the defined types were placed in this encounter.     Penni Homans, MD

## 2015-11-10 NOTE — Assessment & Plan Note (Signed)
Is feeling better today after a trip to the ER. Encouraged to maintain adequate hydration and increase citrus intake.

## 2015-11-10 NOTE — Assessment & Plan Note (Signed)
Encouraged moist heat and gentle stretching as tolerated. May try NSAIDs and prescription meds as directed and report if symptoms worsen or seek immediate care. Add topical treatments

## 2015-11-11 ENCOUNTER — Ambulatory Visit: Payer: Managed Care, Other (non HMO) | Admitting: Family Medicine

## 2016-04-10 ENCOUNTER — Encounter: Payer: Managed Care, Other (non HMO) | Admitting: Family Medicine

## 2016-06-30 ENCOUNTER — Ambulatory Visit (INDEPENDENT_AMBULATORY_CARE_PROVIDER_SITE_OTHER): Payer: Managed Care, Other (non HMO) | Admitting: Family Medicine

## 2016-06-30 ENCOUNTER — Encounter: Payer: Self-pay | Admitting: Family Medicine

## 2016-06-30 DIAGNOSIS — R1013 Epigastric pain: Secondary | ICD-10-CM | POA: Diagnosis not present

## 2016-06-30 DIAGNOSIS — N529 Male erectile dysfunction, unspecified: Secondary | ICD-10-CM

## 2016-06-30 DIAGNOSIS — M109 Gout, unspecified: Secondary | ICD-10-CM | POA: Diagnosis not present

## 2016-06-30 DIAGNOSIS — E6609 Other obesity due to excess calories: Secondary | ICD-10-CM

## 2016-06-30 DIAGNOSIS — N200 Calculus of kidney: Secondary | ICD-10-CM

## 2016-06-30 DIAGNOSIS — E785 Hyperlipidemia, unspecified: Secondary | ICD-10-CM

## 2016-06-30 DIAGNOSIS — E669 Obesity, unspecified: Secondary | ICD-10-CM

## 2016-06-30 DIAGNOSIS — Z23 Encounter for immunization: Secondary | ICD-10-CM | POA: Diagnosis not present

## 2016-06-30 DIAGNOSIS — Z0000 Encounter for general adult medical examination without abnormal findings: Secondary | ICD-10-CM

## 2016-06-30 HISTORY — DX: Obesity, unspecified: E66.9

## 2016-06-30 MED ORDER — INDOMETHACIN 50 MG PO CAPS: 50.0000 mg | ORAL_CAPSULE | Freq: Two times a day (BID) | ORAL | 1 refills | Status: DC | PRN

## 2016-06-30 MED ORDER — OMEPRAZOLE 20 MG PO CPDR: 20.0000 mg | DELAYED_RELEASE_CAPSULE | Freq: Every day | ORAL | 3 refills | Status: DC

## 2016-06-30 MED ORDER — SILDENAFIL CITRATE 20 MG PO TABS: 20.0000 mg | ORAL_TABLET | Freq: Three times a day (TID) | ORAL | 2 refills | Status: DC

## 2016-06-30 MED ORDER — ALLOPURINOL 300 MG PO TABS: 300.0000 mg | ORAL_TABLET | Freq: Every day | ORAL | 3 refills | Status: DC

## 2016-06-30 NOTE — Progress Notes (Signed)
Subjective:    Patient ID: Michael Howe, male    DOB: Mar 31, 1971, 46 y.o.   MRN: [id number]  Chief Complaint  Patient presents with  . Annual Exam    HPI Patient is in today for annual exam and follow up on numerous medical concerns including kidney stones, gout, erectile dysfunction, . He notes some recent trouble with epigastric pain, hyperlipidemia and some dyspepsia. No recent febrile illness or vomiting. No hospitalizations or other concerns. Denies CP/palp/SOB/HA/congestion/fevers or GU c/o. Taking meds as prescribed  Past Medical History:  Diagnosis Date  . Allergic state 02/01/2012  . Allergy   . Cholelithiasis 03/10/2015  . Dyslipidemia 07/26/2013  . Gout   . Obesity 06/30/2016  . Preventative health care 07/14/2012  . Renal lithiasis 11/10/2015  . Umbilical hernia [id number]    Past Surgical History:  Procedure Laterality Date  . TONSILLECTOMY AND ADENOIDECTOMY  1975    Family History  Problem Relation Age of Onset  . Gout Maternal Grandmother   . Heart disease Maternal Grandmother   . Cancer Maternal Grandmother   . Hyperlipidemia Mother   . Diabetes Mother   . Bowel Disease Mother     IBS  . Heart disease Sister   . Autism Son   . Stroke Paternal Grandmother   . Alcohol abuse Father     Social History   Social History  . Marital status: Married    Spouse name: N/A  . Number of children: N/A  . Years of education: N/A   Occupational History  . Not on file.   Social History Main Topics  . Smoking status: Never Smoker  . Smokeless tobacco: Never Used  . Alcohol use No  . Drug use: No  . 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 Not on file   Social History Narrative  . No narrative on file    Outpatient Medications Prior to Visit  Medication Sig Dispense Refill  . allopurinol (ZYLOPRIM) 300 MG tablet Take 1 tablet (300 mg total) by mouth daily. 30 tablet 0    . fexofenadine (ALLEGRA) 180 MG tablet Take 1 tablet (180 mg total) by mouth daily as needed for allergies or rhinitis.    . fluticasone (FLONASE) 50 MCG/ACT nasal spray Place 2 sprays into both nostrils daily as needed for allergies or rhinitis. 16 g 2  . KRILL OIL PO Take by mouth.    Marland Kitchen LEVITRA 20 MG tablet TAKE ONE TABLET BY MOUTH EVERY DAY AS NEEDED 6 tablet 3  . methocarbamol (ROBAXIN) 500 MG tablet Take 1 tablet (500 mg total) by mouth 2 (two) times daily as needed for muscle spasms. 5 tablet 0  . naproxen (NAPROSYN) 500 MG tablet Take 1 tablet (500 mg total) by mouth 2 (two) times daily. 30 tablet 0  . NON FORMULARY Life way Kefir- drink 8 oz daily    . indomethacin (INDOCIN) 50 MG capsule Take 1 capsule (50 mg total) by mouth 2 (two) times daily as needed for moderate pain. (Patient not taking: Reported on 03/05/2015) 60 capsule 1  . omeprazole (PRILOSEC) 20 MG capsule Take 1 capsule (20 mg total) by mouth daily. (Patient not taking: Reported on 03/05/2015) 30 capsule 3   No facility-administered medications prior to visit.     No Known Allergies  Review of Systems  Constitutional: Negative for fever and malaise/fatigue.  HENT: Negative for congestion.  Eyes: Negative for blurred vision.  Respiratory: Negative for cough and shortness of breath.   Cardiovascular: Negative for chest pain, palpitations and leg swelling.  Gastrointestinal: Positive for abdominal pain. Negative for blood in stool, constipation, melena and vomiting.  Musculoskeletal: Positive for back pain. Negative for joint pain.  Skin: Negative for rash.  Neurological: Negative for loss of consciousness and headaches.       Objective:    Physical Exam  Constitutional: He is oriented to person, place, and time. He appears well-developed and well-nourished. No distress.  HENT:  Head: Normocephalic and atraumatic.  Oropharynx erythematous  Eyes: Conjunctivae are normal.  Neck: Normal range of motion. No  thyromegaly present.  Cardiovascular: Normal rate and regular rhythm.   Pulmonary/Chest: Effort normal and breath sounds normal. He has no wheezes.  Abdominal: Soft. Bowel sounds are normal. There is no tenderness. There is no rebound and no guarding.  Musculoskeletal: Normal range of motion. He exhibits no edema or deformity.  Neurological: He is alert and oriented to person, place, and time.  Skin: Skin is warm and dry. He is not diaphoretic.  Psychiatric: He has a normal mood and affect.    BP 110/62 (BP Location: Left Arm, Patient Position: Sitting, Cuff Size: Normal)   Pulse 90   Temp 98.4 F (36.9 C) (Oral)   Ht 5\' 10"  (1.778 m)   Wt 238 lb (108 kg)   SpO2 95%   BMI 34.15 kg/m  Wt Readings from Last 3 Encounters:  06/30/16 238 lb (108 kg)  11/01/15 220 lb 2 oz (99.8 kg)  03/05/15 231 lb 6 oz (105 kg)     Lab Results  Component Value Date   WBC 6.9 06/30/2016   HGB 15.6 06/30/2016   HCT 46.0 06/30/2016   PLT 272.0 06/30/2016   GLUCOSE 93 06/30/2016   CHOL 173 06/30/2016   TRIG 298.0 (H) 06/30/2016   HDL 34.90 (L) 06/30/2016   LDLDIRECT 105.0 06/30/2016   LDLCALC 84 07/24/2013   ALT 50 06/30/2016   AST 25 06/30/2016   NA 141 06/30/2016   K 3.8 06/30/2016   CL 103 06/30/2016   CREATININE 1.09 06/30/2016   BUN 13 06/30/2016   CO2 28 06/30/2016   TSH 1.91 06/30/2016    Lab Results  Component Value Date   TSH 1.91 06/30/2016   Lab Results  Component Value Date   WBC 6.9 06/30/2016   HGB 15.6 06/30/2016   HCT 46.0 06/30/2016   MCV 86.9 06/30/2016   PLT 272.0 06/30/2016   Lab Results  Component Value Date   NA 141 06/30/2016   K 3.8 06/30/2016   CO2 28 06/30/2016   GLUCOSE 93 06/30/2016   BUN 13 06/30/2016   CREATININE 1.09 06/30/2016   BILITOT 0.4 06/30/2016   ALKPHOS 35 (L) 06/30/2016   AST 25 06/30/2016   ALT 50 06/30/2016   PROT 7.3 06/30/2016   ALBUMIN 4.5 06/30/2016   CALCIUM 9.5 06/30/2016   GFR 76.81 06/30/2016   Lab Results    Component Value Date   CHOL 173 06/30/2016   Lab Results  Component Value Date   HDL 34.90 (L) 06/30/2016   Lab Results  Component Value Date   LDLCALC 84 07/24/2013   Lab Results  Component Value Date   TRIG 298.0 (H) 06/30/2016   Lab Results  Component Value Date   CHOLHDL 5 06/30/2016   No results found for: HGBA1C I acted as a Education administrator for Dr. Charlett Blake. [place], [place]  Assessment & Plan:   Problem List Items Addressed This Visit    Dyslipidemia    Encouraged heart healthy diet, increase exercise, avoid trans fats, consider a krill oil cap daily      Relevant Orders   Lipid panel (Completed)   Gout    Maintain hydration, monitor uric acid levels and continue Allopurinol      Relevant Medications   indomethacin (INDOCIN) 50 MG capsule   Other Relevant Orders   Uric acid (Completed)   ED (erectile dysfunction)    Uses Levitra prn      Preventative health care (Chronic)    Patient encouraged to maintain heart healthy diet, regular exercise, adequate sleep. Consider daily probiotics. Take medications as prescribed      Relevant Orders   CBC (Completed)   Comprehensive metabolic panel (Completed)   TSH (Completed)   Lipid panel (Completed)   Uric acid (Completed)   Renal lithiasis    Stay well hydrated maintain adequate hydration      Relevant Medications   allopurinol (ZYLOPRIM) 300 MG tablet   Other Relevant Orders   Comprehensive metabolic panel (Completed)   Obesity    Encouraged DASH diet, decrease po intake and increase exercise as tolerated. Needs 7-8 hours of sleep nightly. Avoid trans fats, eat small, frequent meals every 4-5 hours with lean proteins, complex carbs and healthy fats. Minimize simple carbs. Consider bariatric med for consideration      Epigastric pain    Likely related to reflux, started on acid suppression and minimize NSAIDs report if worsens.       Relevant Medications   omeprazole (PRILOSEC) 20 MG capsule    Other  Visit Diagnoses    Encounter for immunization       Relevant Medications   allopurinol (ZYLOPRIM) 300 MG tablet   indomethacin (INDOCIN) 50 MG capsule   omeprazole (PRILOSEC) 20 MG capsule   Other Relevant Orders   Flu Vaccine QUAD 36+ mos IM (Completed)      I am having Mr. Laflam start on sildenafil. I am also having him maintain his NON FORMULARY, fexofenadine, fluticasone, KRILL OIL PO, LEVITRA, naproxen, methocarbamol, allopurinol, indomethacin, and omeprazole.  Meds ordered this encounter  Medications  . allopurinol (ZYLOPRIM) 300 MG tablet    Sig: Take 1 tablet (300 mg total) by mouth daily.    Dispense:  30 tablet    Refill:  3  . indomethacin (INDOCIN) 50 MG capsule    Sig: Take 1 capsule (50 mg total) by mouth 2 (two) times daily as needed for moderate pain.    Dispense:  60 capsule    Refill:  1  . omeprazole (PRILOSEC) 20 MG capsule    Sig: Take 1 capsule (20 mg total) by mouth daily.    Dispense:  30 capsule    Refill:  3  . sildenafil (REVATIO) 20 MG tablet    Sig: Take 1 tablet (20 mg total) by mouth 3 (three) times daily.    Dispense:  35 tablet    Refill:  2    CMA served as scribe during this visit. History, Physical and Plan performed by medical provider. Documentation and orders reviewed and attested to.  Penni Homans, MD

## 2016-06-30 NOTE — Progress Notes (Signed)
Pre visit review using our clinic review tool, if applicable. No additional management support is needed unless otherwise documented below in the visit note. 

## 2016-06-30 NOTE — Assessment & Plan Note (Signed)
Patient encouraged to maintain heart healthy diet, regular exercise, adequate sleep. Consider daily probiotics. Take medications as prescribed 

## 2016-06-30 NOTE — Assessment & Plan Note (Signed)
Uses Levitra prn

## 2016-06-30 NOTE — Assessment & Plan Note (Signed)
Encouraged heart healthy diet, increase exercise, avoid trans fats, consider a krill oil cap daily 

## 2016-06-30 NOTE — Assessment & Plan Note (Signed)
Maintain hydration, monitor uric acid levels and continue Allopurinol

## 2016-06-30 NOTE — Assessment & Plan Note (Signed)
Stay well hydrated maintain adequate hydration

## 2016-06-30 NOTE — Patient Instructions (Signed)
Preventive Care 40-64 Years, Male Preventive care refers to lifestyle choices and visits with your health care provider that can promote health and wellness. What does preventive care include?  A yearly physical exam. This is also called an annual well check.  Dental exams once or twice a year.  Routine eye exams. Ask your health care provider how often you should have your eyes checked.  Personal lifestyle choices, including:  Daily care of your teeth and gums.  Regular physical activity.  Eating a healthy diet.  Avoiding tobacco and drug use.  Limiting alcohol use.  Practicing safe sex.  Taking low-dose aspirin every day starting at age 50. What happens during an annual well check? The services and screenings done by your health care provider during your annual well check will depend on your age, overall health, lifestyle risk factors, and family history of disease. Counseling  Your health care provider may ask you questions about your:  Alcohol use.  Tobacco use.  Drug use.  Emotional well-being.  Home and relationship well-being.  Sexual activity.  Eating habits.  Work and work environment. Screening  You may have the following tests or measurements:  Height, weight, and BMI.  Blood pressure.  Lipid and cholesterol levels. These may be checked every 5 years, or more frequently if you are over 50 years old.  Skin check.  Lung cancer screening. You may have this screening every year starting at age 55 if you have a 30-pack-year history of smoking and currently smoke or have quit within the past 15 years.  Fecal occult blood test (FOBT) of the stool. You may have this test every year starting at age 50.  Flexible sigmoidoscopy or colonoscopy. You may have a sigmoidoscopy every 5 years or a colonoscopy every 10 years starting at age 50.  Prostate cancer screening. Recommendations will vary depending on your family history and other risks.  Hepatitis C  blood test.  Hepatitis B blood test.  Sexually transmitted disease (STD) testing.  Diabetes screening. This is done by checking your blood sugar (glucose) after you have not eaten for a while (fasting). You may have this done every 1-3 years. Discuss your test results, treatment options, and if necessary, the need for more tests with your health care provider. Vaccines  Your health care provider may recommend certain vaccines, such as:  Influenza vaccine. This is recommended every year.  Tetanus, diphtheria, and acellular pertussis (Tdap, Td) vaccine. You may need a Td booster every 10 years.  Varicella vaccine. You may need this if you have not been vaccinated.  Zoster vaccine. You may need this after age 60.  Measles, mumps, and rubella (MMR) vaccine. You may need at least one dose of MMR if you were born in 1957 or later. You may also need a second dose.  Pneumococcal 13-valent conjugate (PCV13) vaccine. You may need this if you have certain conditions and have not been vaccinated.  Pneumococcal polysaccharide (PPSV23) vaccine. You may need one or two doses if you smoke cigarettes or if you have certain conditions.  Meningococcal vaccine. You may need this if you have certain conditions.  Hepatitis A vaccine. You may need this if you have certain conditions or if you travel or work in places where you may be exposed to hepatitis A.  Hepatitis B vaccine. You may need this if you have certain conditions or if you travel or work in places where you may be exposed to hepatitis B.  Haemophilus influenzae type b (Hib)   vaccine. You may need this if you have certain risk factors. Talk to your health care provider about which screenings and vaccines you need and how often you need them. This information is not intended to replace advice given to you by your health care provider. Make sure you discuss any questions you have with your health care provider. Document Released: 06/21/2015  Document Revised: 02/12/2016 Document Reviewed: 03/26/2015 Elsevier Interactive Patient Education  2017 [employer].

## 2016-06-30 NOTE — Assessment & Plan Note (Addendum)
Encouraged DASH diet, decrease po intake and increase exercise as tolerated. Needs 7-8 hours of sleep nightly. Avoid trans fats, eat small, frequent meals every 4-5 hours with lean proteins, complex carbs and healthy fats. Minimize simple carbs. Consider bariatric med for consideration

## 2016-07-01 LAB — TSH: TSH: 1.91 u[IU]/mL (ref 0.35–4.50)

## 2016-07-01 LAB — COMPREHENSIVE METABOLIC PANEL
ALT: 50 U/L (ref 0–53)
AST: 25 U/L (ref 0–37)
Albumin: 4.5 g/dL (ref 3.5–5.2)
Alkaline Phosphatase: 35 U/L — ABNORMAL LOW (ref 39–117)
BILIRUBIN TOTAL: 0.4 mg/dL (ref 0.2–1.2)
BUN: 13 mg/dL (ref 6–23)
CALCIUM: 9.5 mg/dL (ref 8.4–10.5)
CO2: 28 meq/L (ref 19–32)
Chloride: 103 mEq/L (ref 96–112)
Creatinine, Ser: 1.09 mg/dL (ref 0.40–1.50)
GFR: 76.81 mL/min (ref >60.00)
Glucose, Bld: 93 mg/dL (ref 70–99)
POTASSIUM: 3.8 meq/L (ref 3.5–5.1)
Sodium: 141 mEq/L (ref 135–145)
Total Protein: 7.3 g/dL (ref 6.0–8.3)

## 2016-07-01 LAB — CBC
HEMATOCRIT: 46 % (ref 39.0–52.0)
HEMOGLOBIN: 15.6 g/dL (ref 13.0–17.0)
MCHC: 34 g/dL (ref 30.0–36.0)
MCV: 86.9 fl (ref 78.0–100.0)
PLATELETS: 272 10*3/uL (ref 150.0–400.0)
RBC: 5.3 Mil/uL (ref 4.22–5.81)
RDW: 13.6 % (ref 11.5–15.5)
WBC: 6.9 10*3/uL (ref 4.0–10.5)

## 2016-07-01 LAB — URIC ACID: Uric Acid, Serum: 7.6 mg/dL (ref 4.0–7.8)

## 2016-07-01 LAB — LIPID PANEL
CHOL/HDL RATIO: 5
CHOLESTEROL: 173 mg/dL (ref 0–200)
HDL: 34.9 mg/dL — ABNORMAL LOW (ref >39.00)
NonHDL: 138.07
TRIGLYCERIDES: 298 mg/dL — AB (ref 0.0–149.0)
VLDL: 59.6 mg/dL — ABNORMAL HIGH (ref 0.0–40.0)

## 2016-07-01 LAB — LDL CHOLESTEROL, DIRECT: Direct LDL: 105 mg/dL

## 2016-07-02 ENCOUNTER — Other Ambulatory Visit: Payer: Self-pay

## 2016-07-02 MED ORDER — ATORVASTATIN CALCIUM 10 MG PO TABS: 10.0000 mg | ORAL_TABLET | Freq: Every day | ORAL | 3 refills | Status: DC

## 2016-07-05 DIAGNOSIS — R1013 Epigastric pain: Secondary | ICD-10-CM | POA: Insufficient documentation

## 2016-07-05 NOTE — Assessment & Plan Note (Signed)
Likely related to reflux, started on acid suppression and minimize NSAIDs report if worsens.

## 2016-09-30 IMAGING — CT CT RENAL STONE PROTOCOL
1 of 2 series · 15 of 32 positions shown, 19 images · non-contrast
Comparison: Abdominal sonogram 03/04/2015.

CLINICAL DATA: 44-year-old male complaining of right flank pain for
the past week. Subsequent encounter.

EXAM:
CT ABDOMEN AND PELVIS WITHOUT CONTRAST
TECHNIQUE: Multidetector CT imaging of the abdomen and pelvis was performed
following the standard protocol without IV contrast.

[Series 2: renal stone < 200 lbs 5.0 b31f · axial · 0.84mm/px · z∈[-590,-100]mm · 15 of 108 slices shown, 19 images]
[im 5/108  soft-tissue]
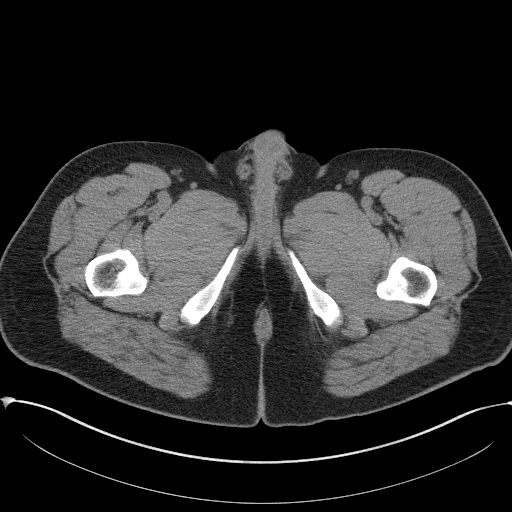
[im 5/108  bone]
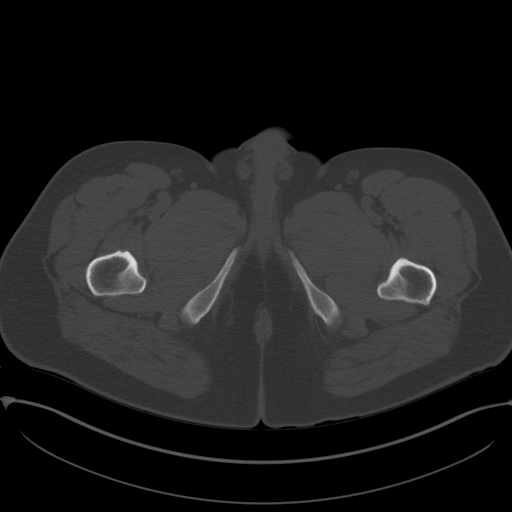
[im 14/108  soft-tissue]
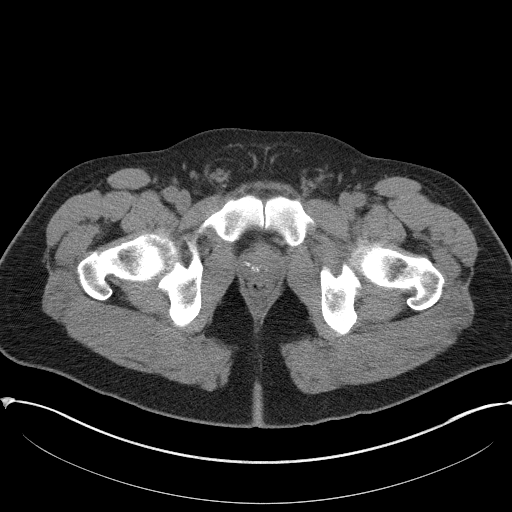
[im 23/108  soft-tissue]
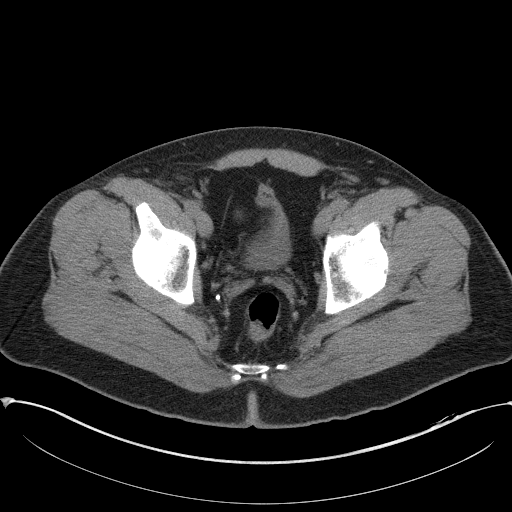
[im 32/108  soft-tissue]
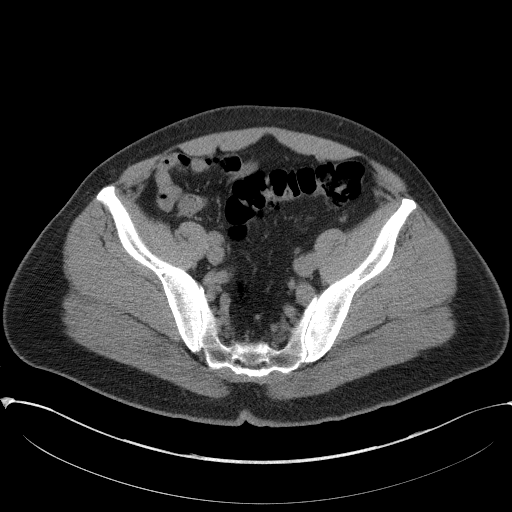
[im 36/108  soft-tissue]
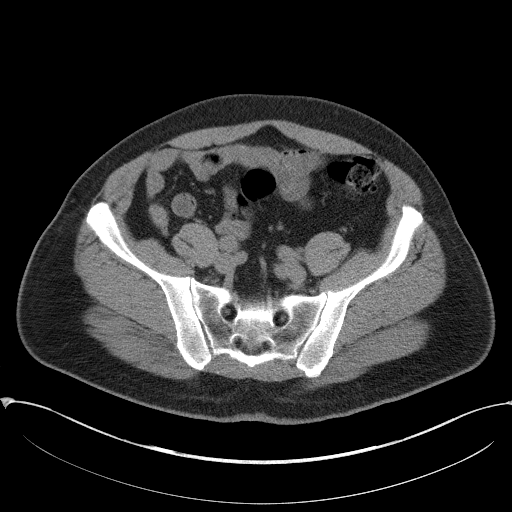
[im 45/108  soft-tissue]
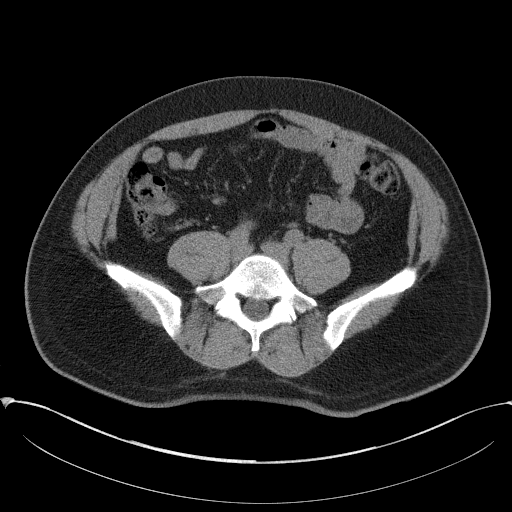
[im 54/108  soft-tissue]
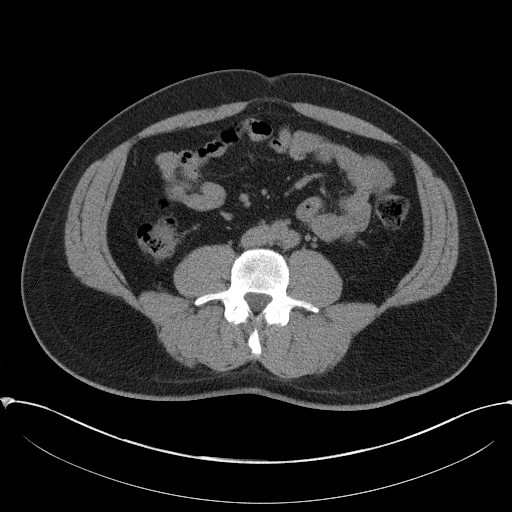
[im 63/108  soft-tissue]
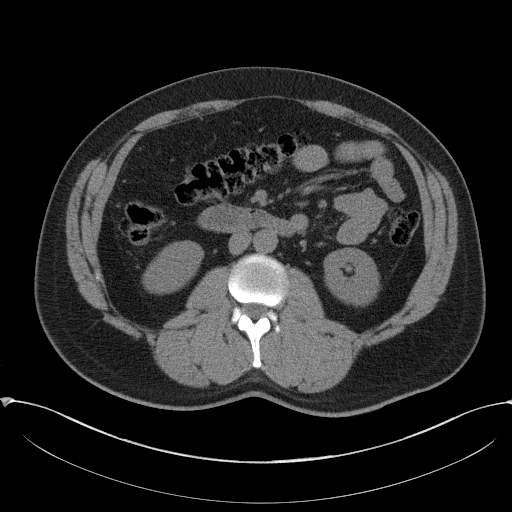
[im 72/108  soft-tissue]
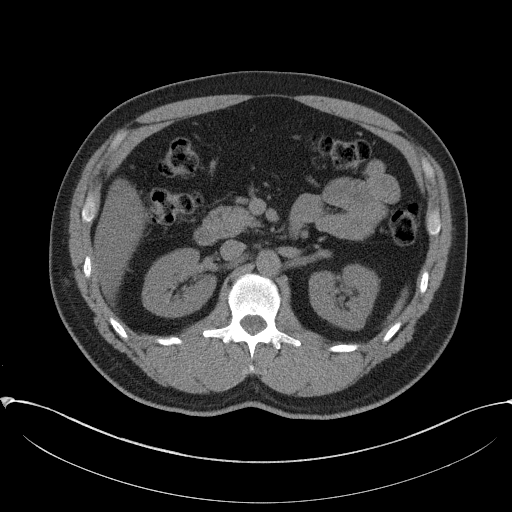
[im 72/108  bone]
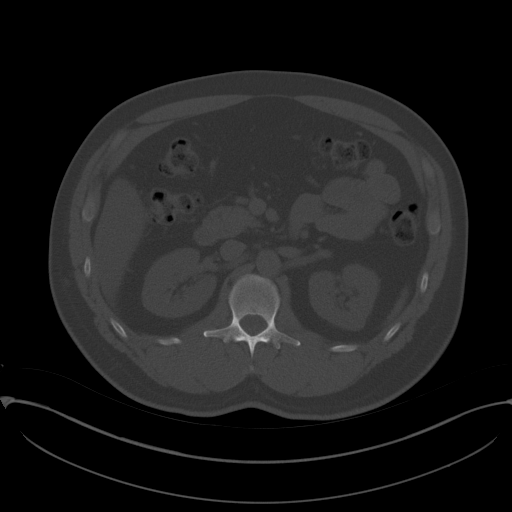
[im 76/108  soft-tissue]
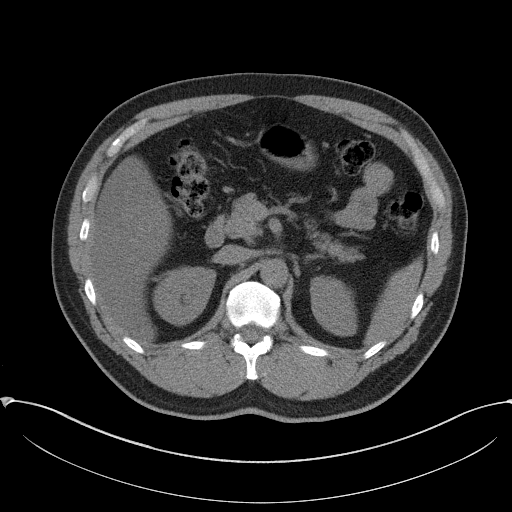
[im 85/108  soft-tissue]
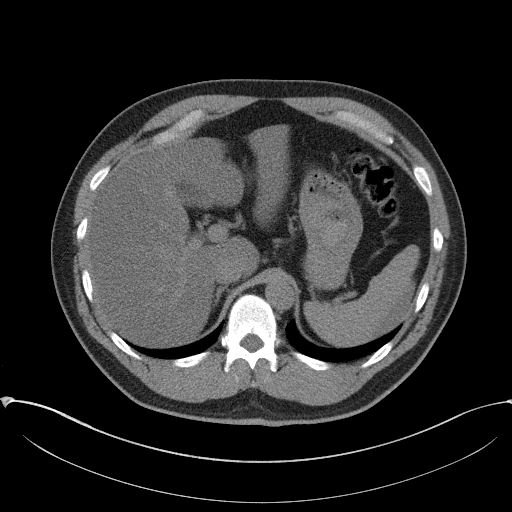
[im 90/108  lung]
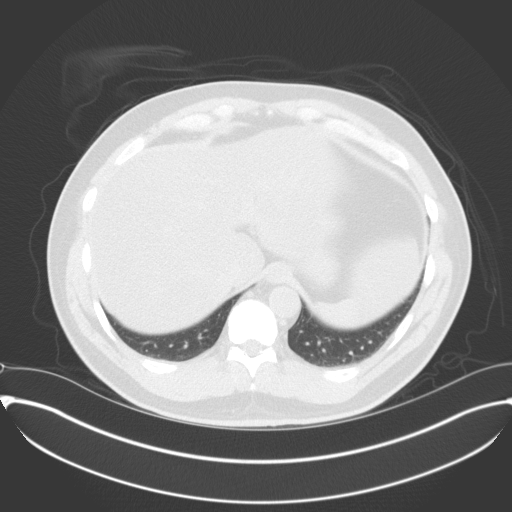
[im 94/108  soft-tissue]
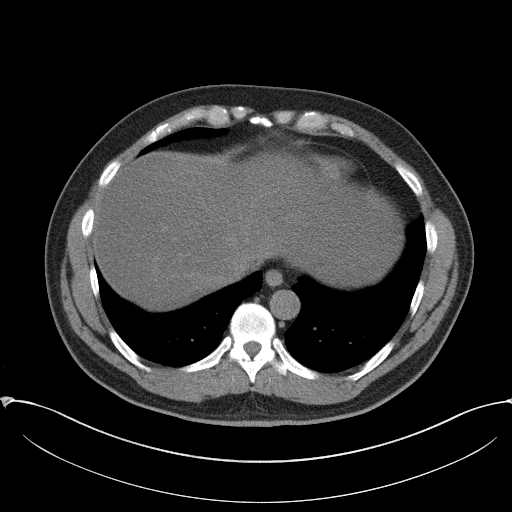
[im 94/108  lung]
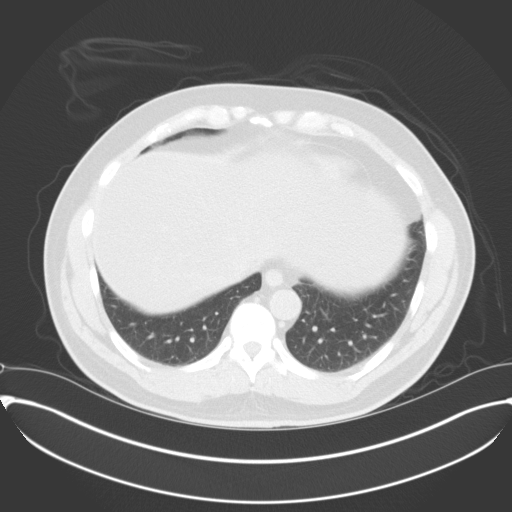
[im 99/108  lung]
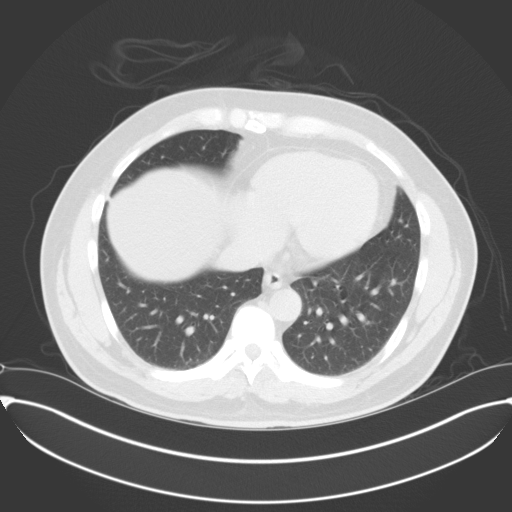
[im 103/108  soft-tissue]
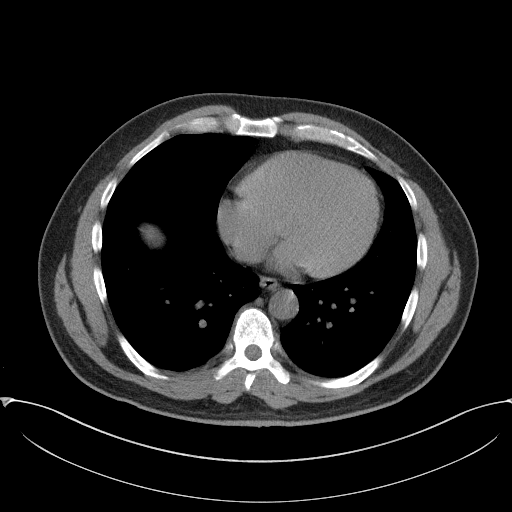
[im 103/108  lung]
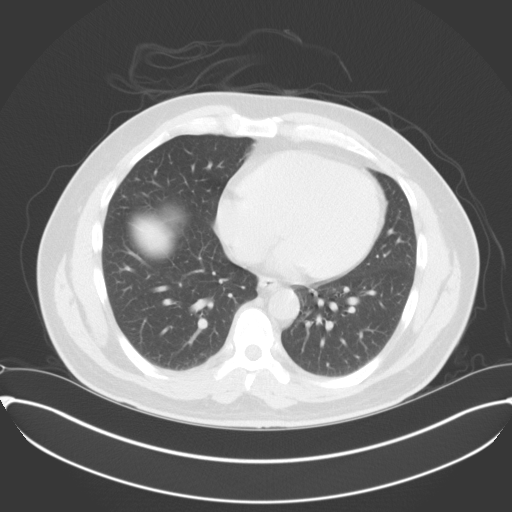

[15 of 32 positions shown; findings below may reference images not displayed]

FINDINGS: Three right upper pole nonobstructing renal calculi measuring
between 4 and 5.5 mm.

Diffuse decreased density of the liver suggestive of fatty
infiltration. Limited by CT for detecting changes of hepatitis.

No calcified gallstone.

Taking into account limitation by non contrast imaging, no worrisome
hepatic, splenic, pancreatic, renal or adrenal lesion.

No extra luminal bowel inflammatory process, free fluid or free air.
Specifically, small portions of the appendix are visualized and do
not appear inflamed. No inflammation surrounds the terminal ileum.
No bowel containing hernia. Small fatty containing periumbilical
hernia incidentally noted.

No abdominal aortic aneurysm. Ectatic common iliac arteries larger
on the right measuring up to 1.7 cm.

Noncontrast filled slightly underdistended views of the urinary
bladder unremarkable. Small calcifications within normal size
prostate gland.

No adenopathy.

No osseous abnormality.  L5-S1 minimal bulge.
IMPRESSION: Three right upper pole nonobstructing renal calculi measuring
between 4 and 5.5 mm.

Diffuse decreased density of the liver suggestive of fatty
infiltration.

No extra luminal bowel inflammatory process, free fluid or free air.

Ectatic common iliac arteries larger on the right measuring up to
1.7 cm.

## 2016-12-31 ENCOUNTER — Ambulatory Visit: Payer: Managed Care, Other (non HMO) | Admitting: Family Medicine

## 2017-01-01 ENCOUNTER — Ambulatory Visit (INDEPENDENT_AMBULATORY_CARE_PROVIDER_SITE_OTHER): Payer: Managed Care, Other (non HMO) | Admitting: Family Medicine

## 2017-01-01 DIAGNOSIS — R1011 Right upper quadrant pain: Secondary | ICD-10-CM | POA: Diagnosis not present

## 2017-01-01 DIAGNOSIS — M1A9XX0 Chronic gout, unspecified, without tophus (tophi): Secondary | ICD-10-CM | POA: Diagnosis not present

## 2017-01-01 DIAGNOSIS — E6609 Other obesity due to excess calories: Secondary | ICD-10-CM | POA: Diagnosis not present

## 2017-01-01 DIAGNOSIS — K429 Umbilical hernia without obstruction or gangrene: Secondary | ICD-10-CM

## 2017-01-01 DIAGNOSIS — E785 Hyperlipidemia, unspecified: Secondary | ICD-10-CM

## 2017-01-01 LAB — COMPREHENSIVE METABOLIC PANEL
ALT: 54 U/L — AB (ref 0–53)
AST: 28 U/L (ref 0–37)
Albumin: 4.2 g/dL (ref 3.5–5.2)
Alkaline Phosphatase: 37 U/L — ABNORMAL LOW (ref 39–117)
BILIRUBIN TOTAL: 0.8 mg/dL (ref 0.2–1.2)
BUN: 11 mg/dL (ref 6–23)
CALCIUM: 9.8 mg/dL (ref 8.4–10.5)
CO2: 26 meq/L (ref 19–32)
CREATININE: 1.07 mg/dL (ref 0.40–1.50)
Chloride: 103 mEq/L (ref 96–112)
GFR: 78.3 mL/min (ref >60.00)
GLUCOSE: 89 mg/dL (ref 70–99)
Potassium: 3.9 mEq/L (ref 3.5–5.1)
SODIUM: 138 meq/L (ref 135–145)
Total Protein: 6.9 g/dL (ref 6.0–8.3)

## 2017-01-01 LAB — CBC
HCT: 45 % (ref 39.0–52.0)
Hemoglobin: 15.1 g/dL (ref 13.0–17.0)
MCHC: 33.4 g/dL (ref 30.0–36.0)
MCV: 88.3 fl (ref 78.0–100.0)
PLATELETS: 248 10*3/uL (ref 150.0–400.0)
RBC: 5.1 Mil/uL (ref 4.22–5.81)
RDW: 14.2 % (ref 11.5–15.5)
WBC: 6.4 10*3/uL (ref 4.0–10.5)

## 2017-01-01 LAB — URIC ACID: URIC ACID, SERUM: 8.1 mg/dL — AB (ref 4.0–7.8)

## 2017-01-01 LAB — LIPID PANEL
CHOL/HDL RATIO: 4
Cholesterol: 137 mg/dL (ref 0–200)
HDL: 32.5 mg/dL — ABNORMAL LOW (ref >39.00)
LDL CALC: 68 mg/dL (ref 0–99)
NONHDL: 104.36
Triglycerides: 181 mg/dL — ABNORMAL HIGH (ref 0.0–149.0)
VLDL: 36.2 mg/dL (ref 0.0–40.0)

## 2017-01-01 LAB — TSH: TSH: 1.05 u[IU]/mL (ref 0.35–4.50)

## 2017-01-01 MED ORDER — SILDENAFIL CITRATE 20 MG PO TABS: 20.0000 mg | ORAL_TABLET | Freq: Three times a day (TID) | ORAL | 5 refills | Status: DC

## 2017-01-01 MED ORDER — OMEPRAZOLE 20 MG PO CPDR: 20.0000 mg | DELAYED_RELEASE_CAPSULE | Freq: Every day | ORAL | 5 refills | Status: DC

## 2017-01-01 MED ORDER — ALLOPURINOL 300 MG PO TABS: 300.0000 mg | ORAL_TABLET | Freq: Every day | ORAL | 5 refills | Status: DC

## 2017-01-01 MED ORDER — ATORVASTATIN CALCIUM 10 MG PO TABS: 10.0000 mg | ORAL_TABLET | Freq: Every day | ORAL | 5 refills | Status: DC

## 2017-01-01 NOTE — Assessment & Plan Note (Signed)
No major flares

## 2017-01-01 NOTE — Assessment & Plan Note (Signed)
Enlarging and tender. Is going to try and loose weight. If worsens then will notify us for referral

## 2017-01-01 NOTE — Assessment & Plan Note (Signed)
Tolerating statin, encouraged heart healthy diet, avoid trans fats, minimize simple carbs and saturated fats. Increase exercise as tolerated 

## 2017-01-01 NOTE — Assessment & Plan Note (Signed)
No complaints today.

## 2017-01-01 NOTE — Assessment & Plan Note (Addendum)
Encouraged DASH diet, decrease po intake and increase exercise as tolerated. Needs 7-8 hours of sleep nightly. Avoid trans fats, eat small, frequent meals every 4-5 hours with lean proteins, complex carbs and healthy fats. Minimize simple carbs, bariatric referral 

## 2017-01-01 NOTE — Progress Notes (Signed)
Subjective:  I acted as a Education administrator for Dr. Charlett Blake. [place], [place]  Patient ID: Michael Howe, male    DOB: 07/10/1970, 46 y.o.   MRN: [id number]  No chief complaint on file.   HPI  Patient is in today for a follow up. He feels mostly well today. He notes his umbilical hernia is occasionally getting tender. He does not describe the discomfort is extreme. His bowels are moving normally. No warmth is noted at the site. No other acute complaints. No recent gout flares. Denies CP/palp/SOB/HA/congestion/fevers/GI or GU c/o. Taking meds as prescribed  Patient Care Team: Mosie Lukes, MD as PCP - General (Family Medicine)   Past Medical History:  Diagnosis Date  . Allergic state 02/01/2012  . Allergy   . Cholelithiasis 03/10/2015  . Dyslipidemia 07/26/2013  . Gout   . Obesity 06/30/2016  . Preventative health care 07/14/2012  . Renal lithiasis 11/10/2015  . Umbilical hernia 1/74/9449    Past Surgical History:  Procedure Laterality Date  . TONSILLECTOMY AND ADENOIDECTOMY  1975    Family History  Problem Relation Age of Onset  . Gout Maternal Grandmother   . Heart disease Maternal Grandmother   . Cancer Maternal Grandmother   . Hyperlipidemia Mother   . Diabetes Mother   . Bowel Disease Mother        IBS  . Heart disease Sister   . Autism Son   . Stroke Paternal Grandmother   . Alcohol abuse Father     Social History   Social History  . Marital status: Married    Spouse name: N/A  . Number of children: N/A  . Years of education: N/A   Occupational History  . Not on file.   Social History Main Topics  . Smoking status: Never Smoker  . Smokeless tobacco: Never Used  . Alcohol use No  . Drug use: No  . 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 Not on file   Social History Narrative  . No narrative on file    Outpatient Medications Prior to Visit  Medication Sig Dispense Refill  .  fexofenadine (ALLEGRA) 180 MG tablet Take 1 tablet (180 mg total) by mouth daily as needed for allergies or rhinitis.    . fluticasone (FLONASE) 50 MCG/ACT nasal spray Place 2 sprays into both nostrils daily as needed for allergies or rhinitis. 16 g 2  . indomethacin (INDOCIN) 50 MG capsule Take 1 capsule (50 mg total) by mouth 2 (two) times daily as needed for moderate pain. 60 capsule 1  . KRILL OIL PO Take by mouth.    . methocarbamol (ROBAXIN) 500 MG tablet Take 1 tablet (500 mg total) by mouth 2 (two) times daily as needed for muscle spasms. 5 tablet 0  . naproxen (NAPROSYN) 500 MG tablet Take 1 tablet (500 mg total) by mouth 2 (two) times daily. 30 tablet 0  . NON FORMULARY Life way Kefir- drink 8 oz daily    . allopurinol (ZYLOPRIM) 300 MG tablet Take 1 tablet (300 mg total) by mouth daily. 30 tablet 3  . atorvastatin (LIPITOR) 10 MG tablet Take 1 tablet (10 mg total) by mouth daily. 30 tablet 3  . LEVITRA 20 MG tablet TAKE ONE TABLET BY MOUTH EVERY DAY AS NEEDED 6 tablet 3  . omeprazole (PRILOSEC) 20 MG capsule Take 1 capsule (20 mg total) by mouth daily. [place]  capsule 3  . sildenafil (REVATIO) 20 MG tablet Take 1 tablet (20 mg total) by mouth 3 (three) times daily. 35 tablet 2   No facility-administered medications prior to visit.     No Known Allergies  Review of Systems  Constitutional: Negative for fever and malaise/fatigue.  HENT: Negative for congestion.   Eyes: Negative for blurred vision.  Respiratory: Negative for cough and shortness of breath.   Cardiovascular: Negative for chest pain, palpitations and leg swelling.  Gastrointestinal: Positive for abdominal pain. Negative for blood in stool, constipation, diarrhea, melena and vomiting.  Musculoskeletal: Negative for back pain.  Skin: Negative for rash.  Neurological: Negative for loss of consciousness and headaches.       Objective:    Physical Exam  Constitutional: He is oriented to person, place, and time. He  appears well-developed and well-nourished. No distress.  HENT:  Head: Normocephalic and atraumatic.  Eyes: Conjunctivae are normal.  Neck: Normal range of motion. No thyromegaly present.  Cardiovascular: Normal rate and regular rhythm.   Pulmonary/Chest: Effort normal and breath sounds normal. He has no wheezes.  Abdominal: Soft. Bowel sounds are normal. There is no tenderness.  1x2 cm umbilical hernia, mildly tense.   Musculoskeletal: Normal range of motion. He exhibits no edema or deformity.  Neurological: He is alert and oriented to person, place, and time.  Skin: Skin is warm and dry. He is not diaphoretic.  Psychiatric: He has a normal mood and affect.    There were no vitals taken for this visit. Wt Readings from Last 3 Encounters:  06/30/16 238 lb (108 kg)  11/01/15 220 lb 2 oz (99.8 kg)  03/05/15 231 lb 6 oz (105 kg)   BP Readings from Last 3 Encounters:  06/30/16 110/62  11/01/15 108/70  10/27/15 110/82     Immunization History  Administered Date(s) Administered  . Influenza,inj,Quad PF,36+ Mos 03/05/2015, 06/30/2016    Health Maintenance  Topic Date Due  . TETANUS/TDAP  06/30/2017 (Originally 09/13/1987)  . HIV Screening  06/30/2017 (Originally 09/13/1983)  . INFLUENZA VACCINE  01/06/2017    Lab Results  Component Value Date   WBC 6.4 01/01/2017   HGB 15.1 01/01/2017   HCT 45.0 01/01/2017   PLT 248.0 01/01/2017   GLUCOSE 89 01/01/2017   CHOL 137 01/01/2017   TRIG 181.0 (H) 01/01/2017   HDL 32.50 (L) 01/01/2017   LDLDIRECT 105.0 06/30/2016   LDLCALC 68 01/01/2017   ALT 54 (H) 01/01/2017   AST 28 01/01/2017   NA 138 01/01/2017   K 3.9 01/01/2017   CL 103 01/01/2017   CREATININE 1.07 01/01/2017   BUN 11 01/01/2017   CO2 26 01/01/2017   TSH 1.05 01/01/2017    Lab Results  Component Value Date   TSH 1.05 01/01/2017   Lab Results  Component Value Date   WBC 6.4 01/01/2017   HGB 15.1 01/01/2017   HCT 45.0 01/01/2017   MCV 88.3 01/01/2017   PLT  248.0 01/01/2017   Lab Results  Component Value Date   NA 138 01/01/2017   K 3.9 01/01/2017   CO2 26 01/01/2017   GLUCOSE 89 01/01/2017   BUN 11 01/01/2017   CREATININE 1.07 01/01/2017   BILITOT 0.8 01/01/2017   ALKPHOS 37 (L) 01/01/2017   AST 28 01/01/2017   ALT 54 (H) 01/01/2017   PROT 6.9 01/01/2017   ALBUMIN 4.2 01/01/2017   CALCIUM 9.8 01/01/2017   GFR 78.30 01/01/2017   Lab Results  Component Value Date  CHOL 137 01/01/2017   Lab Results  Component Value Date   HDL 32.50 (L) 01/01/2017   Lab Results  Component Value Date   LDLCALC 68 01/01/2017   Lab Results  Component Value Date   TRIG 181.0 (H) 01/01/2017   Lab Results  Component Value Date   CHOLHDL 4 01/01/2017   No results found for: HGBA1C       Assessment & Plan:   Problem List Items Addressed This Visit    Dyslipidemia    Tolerating statin, encouraged heart healthy diet, avoid trans fats, minimize simple carbs and saturated fats. Increase exercise as tolerated      Relevant Medications   atorvastatin (LIPITOR) 10 MG tablet   Other Relevant Orders   Lipid panel (Completed)   TSH (Completed)   Gout    No major flares      Relevant Orders   Uric acid (Completed)   RUQ pain    No complaints today      Relevant Orders   CBC (Completed)   Comprehensive metabolic panel (Completed)   Umbilical hernia    Enlarging and tender. Is going to try and loose weight. If worsens then will notify us for referral      Obesity    Encouraged DASH diet, decrease po intake and increase exercise as tolerated. Needs 7-8 hours of sleep nightly. Avoid trans fats, eat small, frequent meals every 4-5 hours with lean proteins, complex carbs and healthy fats. Minimize simple carbs, bariatric referral         I have discontinued Mr. Speir LEVITRA, allopurinol, omeprazole, and atorvastatin. I am also having him start on atorvastatin, omeprazole, and allopurinol. Additionally, I am having him maintain his  NON FORMULARY, fexofenadine, fluticasone, KRILL OIL PO, naproxen, methocarbamol, indomethacin, and sildenafil.  Meds ordered this encounter  Medications  . sildenafil (REVATIO) 20 MG tablet    Sig: Take 1 tablet (20 mg total) by mouth 3 (three) times daily.    Dispense:  35 tablet    Refill:  5  . atorvastatin (LIPITOR) 10 MG tablet    Sig: Take 1 tablet (10 mg total) by mouth daily.    Dispense:  90 tablet    Refill:  5  . omeprazole (PRILOSEC) 20 MG capsule    Sig: Take 1 capsule (20 mg total) by mouth daily.    Dispense:  30 capsule    Refill:  5  . allopurinol (ZYLOPRIM) 300 MG tablet    Sig: Take 1 tablet (300 mg total) by mouth daily.    Dispense:  30 tablet    Refill:  5    CMA served as scribe during this visit. History, Physical and Plan performed by medical provider. Documentation and orders reviewed and attested to.  Penni Homans, MD

## 2017-01-01 NOTE — Patient Instructions (Signed)
Exercising to Lose Weight Exercising can help you to lose weight. In order to lose weight through exercise, you need to do vigorous-intensity exercise. You can tell that you are exercising with vigorous intensity if you are breathing very hard and fast and cannot hold a conversation while exercising. Moderate-intensity exercise helps to maintain your current weight. You can tell that you are exercising at a moderate level if you have a higher heart rate and faster breathing, but you are still able to hold a conversation. How often should I exercise? Choose an activity that you enjoy and set realistic goals. Your health care provider can help you to make an activity plan that works for you. Exercise regularly as directed by your health care provider. This may include:  Doing resistance training twice each week, such as: ? Push-ups. ? Sit-ups. ? Lifting weights. ? Using resistance bands.  Doing a given intensity of exercise for a given amount of time. Choose from these options: ? 150 minutes of moderate-intensity exercise every week. ? 75 minutes of vigorous-intensity exercise every week. ? A mix of moderate-intensity and vigorous-intensity exercise every week.  Children, pregnant women, people who are out of shape, people who are overweight, and older adults may need to consult a health care provider for individual recommendations. If you have any sort of medical condition, be sure to consult your health care provider before starting a new exercise program. What are some activities that can help me to lose weight?  Walking at a rate of at least 4.5 miles an hour.  Jogging or running at a rate of 5 miles per hour.  Biking at a rate of at least 10 miles per hour.  Lap swimming.  Roller-skating or in-line skating.  Cross-country skiing.  Vigorous competitive sports, such as football, basketball, and soccer.  Jumping rope.  Aerobic dancing. How can I be more active in my day-to-day  activities?  Use the stairs instead of the elevator.  Take a walk during your lunch break.  If you drive, park your car farther away from work or school.  If you take public transportation, get off one stop early and walk the rest of the way.  Make all of your phone calls while standing up and walking around.  Get up, stretch, and walk around every 30 minutes throughout the day. What guidelines should I follow while exercising?  Do not exercise so much that you hurt yourself, feel dizzy, or get very short of breath.  Consult your health care provider prior to starting a new exercise program.  Wear comfortable clothes and shoes with good support.  Drink plenty of water while you exercise to prevent dehydration or heat stroke. Body water is lost during exercise and must be replaced.  Work out until you breathe faster and your heart beats faster. This information is not intended to replace advice given to you by your health care provider. Make sure you discuss any questions you have with your health care provider. Document Released: 06/27/2010 Document Revised: 10/31/2015 Document Reviewed: 10/26/2013 Elsevier Interactive Patient Education  2018 Elsevier Inc.  

## 2017-02-08 ENCOUNTER — Encounter: Payer: Self-pay | Admitting: Family Medicine

## 2017-02-17 NOTE — Telephone Encounter (Signed)
lvm on [phone] advising to call and schedule new patient appointment with Dr. Charlett Blake

## 2017-07-09 ENCOUNTER — Encounter: Payer: Self-pay | Admitting: Family Medicine

## 2017-07-09 ENCOUNTER — Ambulatory Visit (INDEPENDENT_AMBULATORY_CARE_PROVIDER_SITE_OTHER): Payer: Managed Care, Other (non HMO) | Admitting: Family Medicine

## 2017-07-09 DIAGNOSIS — Z0000 Encounter for general adult medical examination without abnormal findings: Secondary | ICD-10-CM | POA: Diagnosis not present

## 2017-07-09 DIAGNOSIS — E785 Hyperlipidemia, unspecified: Secondary | ICD-10-CM | POA: Diagnosis not present

## 2017-07-09 DIAGNOSIS — E6609 Other obesity due to excess calories: Secondary | ICD-10-CM | POA: Diagnosis not present

## 2017-07-09 DIAGNOSIS — M1A9XX0 Chronic gout, unspecified, without tophus (tophi): Secondary | ICD-10-CM | POA: Diagnosis not present

## 2017-07-09 LAB — LIPID PANEL
Cholesterol: 135 mg/dL (ref 0–200)
HDL: 41.6 mg/dL (ref >39.00)
LDL Cholesterol: 60 mg/dL (ref 0–99)
NONHDL: 92.98
Total CHOL/HDL Ratio: 3
Triglycerides: 167 mg/dL — ABNORMAL HIGH (ref 0.0–149.0)
VLDL: 33.4 mg/dL (ref 0.0–40.0)

## 2017-07-09 LAB — COMPREHENSIVE METABOLIC PANEL
ALT: 41 U/L (ref 0–53)
AST: 24 U/L (ref 0–37)
Albumin: 4.4 g/dL (ref 3.5–5.2)
Alkaline Phosphatase: 41 U/L (ref 39–117)
BILIRUBIN TOTAL: 0.5 mg/dL (ref 0.2–1.2)
BUN: 13 mg/dL (ref 6–23)
CO2: 29 meq/L (ref 19–32)
CREATININE: 1.04 mg/dL (ref 0.40–1.50)
Calcium: 9.5 mg/dL (ref 8.4–10.5)
Chloride: 101 mEq/L (ref 96–112)
GFR: 80.74 mL/min (ref >60.00)
Glucose, Bld: 100 mg/dL — ABNORMAL HIGH (ref 70–99)
Potassium: 3.9 mEq/L (ref 3.5–5.1)
Sodium: 139 mEq/L (ref 135–145)
TOTAL PROTEIN: 7.3 g/dL (ref 6.0–8.3)

## 2017-07-09 LAB — CBC
HCT: 46 % (ref 39.0–52.0)
Hemoglobin: 15.6 g/dL (ref 13.0–17.0)
MCHC: 33.8 g/dL (ref 30.0–36.0)
MCV: 87.9 fl (ref 78.0–100.0)
PLATELETS: 274 10*3/uL (ref 150.0–400.0)
RBC: 5.23 Mil/uL (ref 4.22–5.81)
RDW: 14 % (ref 11.5–15.5)
WBC: 5.3 10*3/uL (ref 4.0–10.5)

## 2017-07-09 LAB — TSH: TSH: 2.45 u[IU]/mL (ref 0.35–4.50)

## 2017-07-09 LAB — URIC ACID: URIC ACID, SERUM: 6.1 mg/dL (ref 4.0–7.8)

## 2017-07-09 MED ORDER — ALLOPURINOL 300 MG PO TABS: 300.0000 mg | ORAL_TABLET | Freq: Every day | ORAL | 3 refills | Status: DC

## 2017-07-09 NOTE — Assessment & Plan Note (Addendum)
Encouraged DASH diet, decrease po intake and increase exercise as tolerated. Needs 7-8 hours of sleep nightly. Avoid trans fats, eat small, frequent meals every 4-5 hours with lean proteins, complex carbs and healthy fats. Minimize simple carbs. Good weight loss.

## 2017-07-09 NOTE — Progress Notes (Signed)
Subjective:  I acted as a Education administrator for [employer]. [place], [place]   Patient ID: Michael Howe, male    DOB: [birth year], 47 y.o.   MRN: [id number]  Chief Complaint  Patient presents with  . Annual Exam    HPI  Patient is in today for annual exam and follow up on chronic medical concerns. Is feeling well today. Has had some trouble with some pain in his left arm. From elbow to hand at times. Is better now but he does believe it was gout and it improved with increased hydration. No other recent new complaint. No recent febrile illness or hospitalization. He is exercising several times a week. Is trying to maintain a heart healthy diet. Is doing well with ADLs. Denies CP/palp/SOB/HA/congestion/fevers/GI or GU c/o. Taking meds as prescribed  Patient Care Team: Mosie Lukes, MD as PCP - General (Family Medicine)   Past Medical History:  Diagnosis Date  . Allergic state 02/01/2012  . Allergy   . Cholelithiasis 03/10/2015  . Dyslipidemia 07/26/2013  . Gout   . Obesity 06/30/2016  . Preventative health care 07/14/2012  . Renal lithiasis 11/10/2015  . Umbilical hernia 6/57/8469    Past Surgical History:  Procedure Laterality Date  . TONSILLECTOMY AND ADENOIDECTOMY  1975    Family History  Problem Relation Age of Onset  . Gout Maternal Grandmother   . Heart disease Maternal Grandmother   . Cancer Maternal Grandmother   . Hyperlipidemia Mother   . Diabetes Mother   . Bowel Disease Mother        IBS  . Heart disease Sister   . Autism Son   . Stroke Paternal Grandmother   . Alcohol abuse Father     Social History   Socioeconomic History  . Marital status: Married    Spouse name: Not on file  . Number of children: Not on file  . Years of education: Not on file  . Highest education level: Not on file  Social Needs  . Financial resource strain: Not on file  . Food insecurity - worry: Not on file  . Food insecurity - inability: Not on file  . Transportation needs - medical: Not on  file  . Transportation needs - non-medical: Not on file  Occupational History  . Not on file  Tobacco Use  . Smoking status: Never Smoker  . Smokeless tobacco: Never Used  Substance and Sexual Activity  . Alcohol use: No  . Drug use: No  . 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 Not on file  Social History Narrative  . Not on file    Outpatient Medications Prior to Visit  Medication Sig Dispense Refill  . atorvastatin (LIPITOR) 10 MG tablet Take 1 tablet (10 mg total) by mouth daily. 90 tablet 5  . fexofenadine (ALLEGRA) 180 MG tablet Take 1 tablet (180 mg total) by mouth daily as needed for allergies or rhinitis.    . fluticasone (FLONASE) 50 MCG/ACT nasal spray Place 2 sprays into both nostrils daily as needed for allergies or rhinitis. 16 g 2  . indomethacin (INDOCIN) 50 MG capsule Take 1 capsule (50 mg total) by mouth 2 (two) times daily as needed for moderate pain. 60 capsule 1  . KRILL OIL PO Take by mouth.    . methocarbamol (ROBAXIN) 500 MG tablet Take 1 tablet (500 mg total) by mouth 2 (two) times daily as  needed for muscle spasms. 5 tablet 0  . naproxen (NAPROSYN) 500 MG tablet Take 1 tablet (500 mg total) by mouth 2 (two) times daily. 30 tablet 0  . NON FORMULARY Life way Kefir- drink 8 oz daily    . omeprazole (PRILOSEC) 20 MG capsule Take 1 capsule (20 mg total) by mouth daily. 30 capsule 5  . sildenafil (REVATIO) 20 MG tablet Take 1 tablet (20 mg total) by mouth 3 (three) times daily. 35 tablet 5  . allopurinol (ZYLOPRIM) 300 MG tablet Take 1 tablet (300 mg total) by mouth daily. 30 tablet 5   No facility-administered medications prior to visit.     No Known Allergies  Review of Systems  Constitutional: Negative for fever.  HENT: Negative for congestion.   Eyes: Negative for blurred vision.  Respiratory: Negative for cough.   Cardiovascular: Negative for chest pain and  palpitations.  Gastrointestinal: Negative for vomiting.  Musculoskeletal: Positive for joint pain. Negative for back pain.  Skin: Negative for rash.  Neurological: Negative for loss of consciousness and headaches.       Objective:    Physical Exam  Constitutional: He is oriented to person, place, and time. He appears well-developed and well-nourished. No distress.  HENT:  Head: Normocephalic and atraumatic.  Eyes: Conjunctivae are normal.  Neck: Neck supple. No thyromegaly present.  Cardiovascular: Normal rate, regular rhythm and normal heart sounds.  No murmur heard. Pulmonary/Chest: Effort normal and breath sounds normal. No respiratory distress. He has no wheezes.  Abdominal: Soft. Bowel sounds are normal. He exhibits no mass. There is no tenderness.  Musculoskeletal: He exhibits no edema.  Lymphadenopathy:    He has no cervical adenopathy.  Neurological: He is alert and oriented to person, place, and time.  Skin: Skin is warm and dry.  Psychiatric: He has a normal mood and affect. His behavior is normal.    BP 118/62 (BP Location: Left Arm, Patient Position: Sitting, Cuff Size: Large)   Pulse 83   Temp 97.9 F (36.6 C) (Oral)   Resp 18   Ht 5' 10.08" (1.78 m)   Wt 229 lb 12.8 oz (104.2 kg)   SpO2 98%   BMI 32.90 kg/m  Wt Readings from Last 3 Encounters:  07/09/17 229 lb 12.8 oz (104.2 kg)  06/30/16 238 lb (108 kg)  11/01/15 220 lb 2 oz (99.8 kg)   BP Readings from Last 3 Encounters:  07/09/17 118/62  06/30/16 110/62  11/01/15 108/70     Immunization History  Administered Date(s) Administered  . Influenza,inj,Quad PF,6+ Mos 03/05/2015, 06/30/2016    Health Maintenance  Topic Date Due  . HIV Screening  09/13/1983  . TETANUS/TDAP  09/13/1987  . INFLUENZA VACCINE  Completed    Lab Results  Component Value Date   WBC 5.3 07/09/2017   HGB 15.6 07/09/2017   HCT 46.0 07/09/2017   PLT 274.0 07/09/2017   GLUCOSE 100 (H) 07/09/2017   CHOL 135 07/09/2017    TRIG 167.0 (H) 07/09/2017   HDL 41.60 07/09/2017   LDLDIRECT 105.0 06/30/2016   LDLCALC 60 07/09/2017   ALT 41 07/09/2017   AST 24 07/09/2017   NA 139 07/09/2017   K 3.9 07/09/2017   CL 101 07/09/2017   CREATININE 1.04 07/09/2017   BUN 13 07/09/2017   CO2 29 07/09/2017   TSH 2.45 07/09/2017    Lab Results  Component Value Date   TSH 2.45 07/09/2017   Lab Results  Component Value Date   WBC 5.3 07/09/2017  HGB 15.6 07/09/2017   HCT 46.0 07/09/2017   MCV 87.9 07/09/2017   PLT 274.0 07/09/2017   Lab Results  Component Value Date   NA 139 07/09/2017   K 3.9 07/09/2017   CO2 29 07/09/2017   GLUCOSE 100 (H) 07/09/2017   BUN 13 07/09/2017   CREATININE 1.04 07/09/2017   BILITOT 0.5 07/09/2017   ALKPHOS 41 07/09/2017   AST 24 07/09/2017   ALT 41 07/09/2017   PROT 7.3 07/09/2017   ALBUMIN 4.4 07/09/2017   CALCIUM 9.5 07/09/2017   GFR 80.74 07/09/2017   Lab Results  Component Value Date   CHOL 135 07/09/2017   Lab Results  Component Value Date   HDL 41.60 07/09/2017   Lab Results  Component Value Date   LDLCALC 60 07/09/2017   Lab Results  Component Value Date   TRIG 167.0 (H) 07/09/2017   Lab Results  Component Value Date   CHOLHDL 3 07/09/2017   No results found for: HGBA1C       Assessment & Plan:   Problem List Items Addressed This Visit    Dyslipidemia    Encouraged heart healthy diet, increase exercise, avoid trans fats, consider a krill oil cap daily      Relevant Orders   Lipid panel (Completed)   CBC (Completed)   Comprehensive metabolic panel (Completed)   TSH (Completed)   Gout    One minoe recent flare resolved with improved hydration      Relevant Orders   Uric acid (Completed)   Preventative health care (Chronic)    Patient encouraged to maintain heart healthy diet, regular exercise, adequate sleep. Consider daily probiotics. Take medications as prescribed. Labs reviewed and ordered.      Relevant Orders   CBC (Completed)    Comprehensive metabolic panel (Completed)   TSH (Completed)   Obesity    Encouraged DASH diet, decrease po intake and increase exercise as tolerated. Needs 7-8 hours of sleep nightly. Avoid trans fats, eat small, frequent meals every 4-5 hours with lean proteins, complex carbs and healthy fats. Minimize simple carbs. Good weight loss.          I am having Michael Howe maintain his NON FORMULARY, fexofenadine, fluticasone, KRILL OIL PO, naproxen, methocarbamol, indomethacin, sildenafil, atorvastatin, omeprazole, and allopurinol.  Meds ordered this encounter  Medications  . allopurinol (ZYLOPRIM) 300 MG tablet    Sig: Take 1 tablet (300 mg total) by mouth daily.    Dispense:  90 tablet    Refill:  3    CMA served as scribe during this visit. History, Physical and Plan performed by medical provider. Documentation and orders reviewed and attested to.  Penni Homans, MD

## 2017-07-09 NOTE — Assessment & Plan Note (Addendum)
Patient encouraged to maintain heart healthy diet, regular exercise, adequate sleep. Consider daily probiotics. Take medications as prescribed. Labs reviewed and ordered 

## 2017-07-09 NOTE — Assessment & Plan Note (Signed)
Encouraged heart healthy diet, increase exercise, avoid trans fats, consider a krill oil cap daily 

## 2017-07-09 NOTE — Assessment & Plan Note (Addendum)
One minoe recent flare resolved with improved hydration

## 2017-07-09 NOTE — Patient Instructions (Signed)

## 2017-07-15 ENCOUNTER — Encounter: Payer: Self-pay | Admitting: Family Medicine

## 2017-12-20 DIAGNOSIS — M109 Gout, unspecified: Secondary | ICD-10-CM | POA: Diagnosis not present

## 2018-01-14 ENCOUNTER — Telehealth: Payer: Self-pay | Admitting: Family Medicine

## 2018-01-14 ENCOUNTER — Other Ambulatory Visit: Payer: Self-pay | Admitting: Family Medicine

## 2018-01-14 ENCOUNTER — Ambulatory Visit: Payer: 59 | Admitting: Family Medicine

## 2018-01-14 ENCOUNTER — Encounter: Payer: Self-pay | Admitting: Family Medicine

## 2018-01-14 VITALS — BP 121/74 | HR 78 | Temp 98.1°F | Ht 70.0 in | Wt 225.6 lb

## 2018-01-14 DIAGNOSIS — N200 Calculus of kidney: Secondary | ICD-10-CM

## 2018-01-14 DIAGNOSIS — E785 Hyperlipidemia, unspecified: Secondary | ICD-10-CM

## 2018-01-14 DIAGNOSIS — M1A9XX0 Chronic gout, unspecified, without tophus (tophi): Secondary | ICD-10-CM | POA: Diagnosis not present

## 2018-01-14 DIAGNOSIS — M25551 Pain in right hip: Secondary | ICD-10-CM | POA: Diagnosis not present

## 2018-01-14 LAB — COMPREHENSIVE METABOLIC PANEL
ALBUMIN: 4.4 g/dL (ref 3.5–5.2)
ALK PHOS: 43 U/L (ref 39–117)
ALT: 29 U/L (ref 0–53)
AST: 22 U/L (ref 0–37)
BUN: 13 mg/dL (ref 6–23)
CALCIUM: 9.7 mg/dL (ref 8.4–10.5)
CO2: 30 mEq/L (ref 19–32)
CREATININE: 1.07 mg/dL (ref 0.40–1.50)
Chloride: 103 mEq/L (ref 96–112)
GFR: 77.96 mL/min (ref >60.00)
Glucose, Bld: 91 mg/dL (ref 70–99)
Potassium: 4.5 mEq/L (ref 3.5–5.1)
SODIUM: 140 meq/L (ref 135–145)
TOTAL PROTEIN: 6.9 g/dL (ref 6.0–8.3)
Total Bilirubin: 0.8 mg/dL (ref 0.2–1.2)

## 2018-01-14 LAB — LIPID PANEL
CHOLESTEROL: 121 mg/dL (ref 0–200)
HDL: 38.9 mg/dL — ABNORMAL LOW (ref >39.00)
LDL Cholesterol: 59 mg/dL (ref 0–99)
NonHDL: 82.03
TRIGLYCERIDES: 115 mg/dL (ref 0.0–149.0)
Total CHOL/HDL Ratio: 3
VLDL: 23 mg/dL (ref 0.0–40.0)

## 2018-01-14 LAB — CBC
HCT: 44.4 % (ref 39.0–52.0)
Hemoglobin: 15.2 g/dL (ref 13.0–17.0)
MCHC: 34.3 g/dL (ref 30.0–36.0)
MCV: 87.6 fl (ref 78.0–100.0)
PLATELETS: 266 10*3/uL (ref 150.0–400.0)
RBC: 5.07 Mil/uL (ref 4.22–5.81)
RDW: 14.1 % (ref 11.5–15.5)
WBC: 5.1 10*3/uL (ref 4.0–10.5)

## 2018-01-14 LAB — URIC ACID: URIC ACID, SERUM: 5.8 mg/dL (ref 4.0–7.8)

## 2018-01-14 LAB — TSH: TSH: 1.2 u[IU]/mL (ref 0.35–4.50)

## 2018-01-14 MED ORDER — ATORVASTATIN CALCIUM 10 MG PO TABS: 10.0000 mg | ORAL_TABLET | Freq: Every day | ORAL | 5 refills | Status: DC

## 2018-01-14 MED ORDER — OMEPRAZOLE 20 MG PO CPDR: 20.0000 mg | DELAYED_RELEASE_CAPSULE | Freq: Every day | ORAL | 5 refills | Status: DC

## 2018-01-14 MED ORDER — ALLOPURINOL 300 MG PO TABS: 300.0000 mg | ORAL_TABLET | Freq: Every day | ORAL | 3 refills | Status: DC

## 2018-01-14 MED ORDER — SILDENAFIL CITRATE 20 MG PO TABS
20.0000 mg | ORAL_TABLET | Freq: Three times a day (TID) | ORAL | 5 refills | Status: DC
Start: 2018-01-14 — End: 2018-06-24

## 2018-01-14 MED ORDER — METHYLPREDNISOLONE 4 MG PO TABS: ORAL_TABLET | ORAL | 0 refills | Status: DC

## 2018-01-14 MED ORDER — METHYLPREDNISOLONE SODIUM SUCC 125 MG IJ SOLR: 125.0000 mg | Freq: Once | INTRAMUSCULAR | 0 refills | Status: AC

## 2018-01-14 NOTE — Assessment & Plan Note (Signed)
Hydrate and monitor 

## 2018-01-14 NOTE — Patient Instructions (Signed)
For poison ivy wash DAWN dish soap and cool water, wash hands and all clothes and equipment.  Then cleanse all of the area with [place] to cut the oils.  Poison Ivy Dermatitis Poison ivy dermatitis is redness and soreness (inflammation) of the skin. It is caused by a chemical that is found on the leaves of the poison ivy plant. You may also have itching, a rash, and blisters. Symptoms often clear up in 1-2 weeks. You may get this condition by touching a poison ivy plant. You can also get it by touching something that has the chemical on it. This may include animals or objects that have come in contact with the plant. Follow these instructions at home: General instructions  Take or apply over-the-counter and prescription medicines only as told by your doctor.  If you touch poison ivy, wash your skin with soap and cold water right away.  Use hydrocortisone creams or calamine lotion as needed to help with itching.  Take oatmeal baths as needed. Use colloidal oatmeal. You can get this at a pharmacy or grocery store. Follow the instructions on the package.  Do not scratch or rub your skin.  While you have the rash, wash your clothes right after you wear them. Prevention  Know what poison ivy looks like so you can avoid it. This plant has three leaves with flowering branches on a single stem. The leaves are glossy. They have uneven edges that come to a point at the front.  If you have touched poison ivy, wash with soap and water right away. Be sure to wash under your fingernails.  When hiking or camping, wear long pants, a long-sleeved shirt, tall socks, and hiking boots. You can also use a lotion on your skin that helps to prevent contact with the chemical on the plant.  If you think that your clothes or outdoor gear came in contact with poison ivy, rinse them off with a garden hose before you bring them inside your house. Contact a doctor if:  You have open sores in the rash  area.  You have more redness, swelling, or pain in the affected area.  You have redness that spreads beyond the rash area.  You have fluid, blood, or pus coming from the affected area.  You have a fever.  You have a rash over a large area of your body.  You have a rash on your eyes, mouth, or genitals.  Your rash does not get better after a few days. Get help right away if:  Your face swells or your eyes swell shut.  You have trouble breathing.  You have trouble swallowing. This information is not intended to replace advice given to you by your health care provider. Make sure you discuss any questions you have with your health care provider. Document Released: 06/27/2010 Document Revised: 10/31/2015 Document Reviewed: 10/31/2014 Elsevier Interactive Patient Education  [employer].

## 2018-01-14 NOTE — Assessment & Plan Note (Signed)
Encouraged heart healthy diet, increase exercise, avoid trans fats, consider a krill oil cap daily 

## 2018-01-14 NOTE — Telephone Encounter (Signed)
I have sent in the correct medrol dosepak to the pharmacy

## 2018-01-14 NOTE — Telephone Encounter (Signed)
Copied from [place] [id number]. Topic: Quick Communication - Rx Refill/Question >> Jan 14, 2018 10:40 AM Burchel, Abbi R wrote: Medication: methylPREDNISolone sodium succinate (SOLU-MEDROL) 125 mg/2 mL injection  Don states he would like to verify that this rx was supposed to come to:  [facility], [place]. [address]  [phone] (Phone) [phone] (Fax)  Timmothy Sours also states that his pharmacy does not have this in stock, and pt's insurance will not cover it from a retail pharmacy.

## 2018-01-14 NOTE — Telephone Encounter (Signed)
Pharmacy states they do not carry Solu Medrol in the pharmcy.

## 2018-01-14 NOTE — Telephone Encounter (Signed)
Noted  

## 2018-01-16 DIAGNOSIS — M25551 Pain in right hip: Secondary | ICD-10-CM | POA: Insufficient documentation

## 2018-01-16 NOTE — Assessment & Plan Note (Signed)
Hydrate with lemon water and avoid offending foods.

## 2018-01-16 NOTE — Progress Notes (Signed)
Subjective:    Patient ID: Michael Howe, male    DOB: 17-Apr-1971, 47 y.o.   MRN: [id number]  Chief Complaint  Patient presents with  . Follow-up    Pt here for 6 month f/u visit. Will need med refills.     HPI Patient is in today for follow up. He continues to struggle with hip pain and his right hip can be trouble some days and is then OK other days. No falls or recent injury. No incontinence. No radicular symptoms. Well controlled, no changes to meds. Denies CP/palp/SOB/HA/congestion/fevers/GI or GU c/o. Taking meds as prescribed  Past Medical History:  Diagnosis Date  . Allergic state 02/01/2012  . Allergy   . Cholelithiasis 03/10/2015  . Dyslipidemia 07/26/2013  . Gout   . Obesity 06/30/2016  . Preventative health care 07/14/2012  . Renal lithiasis 11/10/2015  . Umbilical hernia 9/38/1017    Past Surgical History:  Procedure Laterality Date  . TONSILLECTOMY AND ADENOIDECTOMY  1975    Family History  Problem Relation Age of Onset  . Gout Maternal Grandmother   . Heart disease Maternal Grandmother   . Cancer Maternal Grandmother   . Hyperlipidemia Mother   . Diabetes Mother   . Bowel Disease Mother        IBS  . Heart disease Sister   . Autism Son   . Stroke Paternal Grandmother   . Alcohol abuse Father     Social History   Socioeconomic History  . Marital status: Married    Spouse name: Not on file  . Number of children: Not on file  . Years of education: Not on file  . Highest education level: Not on file  Occupational History  . Not on file  Social Needs  . Financial resource strain: Not on file  . Food insecurity:    Worry: Not on file    Inability: Not on file  . Transportation needs:    Medical: Not on file    Non-medical: Not on file  Tobacco Use  . Smoking status: Never Smoker  . Smokeless tobacco: Never Used  Substance and Sexual Activity  . Alcohol use: No  . Drug use: No  . 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 Physical activity:    Days per week: Not on file    Minutes per session: Not on file  . Stress: Not on file  Relationships  . Social connections:    Talks on phone: Not on file    Gets together: Not on file    Attends religious service: Not on file    Active member of club or organization: Not on file    Attends meetings of clubs or organizations: Not on file    Relationship status: Not on file  . Intimate partner violence:    Fear of current or ex partner: Not on file    Emotionally abused: Not on file    Physically abused: Not on file    Forced sexual activity: Not on file  Other Topics Concern  . Not on file  Social History Narrative  . Not on file    Outpatient Medications Prior to Visit  Medication Sig Dispense Refill  . fexofenadine (ALLEGRA) 180 MG tablet Take 1 tablet (180 mg total) by mouth daily as needed for allergies or rhinitis.    . fluticasone (FLONASE) 50 MCG/ACT nasal spray Place 2 sprays into  both nostrils daily as needed for allergies or rhinitis. 16 g 2  . indomethacin (INDOCIN) 50 MG capsule Take 1 capsule (50 mg total) by mouth 2 (two) times daily as needed for moderate pain. 60 capsule 1  . KRILL OIL PO Take by mouth.    . NON FORMULARY Life way Kefir- drink 8 oz daily    . allopurinol (ZYLOPRIM) 300 MG tablet Take 1 tablet (300 mg total) by mouth daily. 90 tablet 3  . atorvastatin (LIPITOR) 10 MG tablet Take 1 tablet (10 mg total) by mouth daily. 90 tablet 5  . methocarbamol (ROBAXIN) 500 MG tablet Take 1 tablet (500 mg total) by mouth 2 (two) times daily as needed for muscle spasms. 5 tablet 0  . naproxen (NAPROSYN) 500 MG tablet Take 1 tablet (500 mg total) by mouth 2 (two) times daily. 30 tablet 0  . omeprazole (PRILOSEC) 20 MG capsule Take 1 capsule (20 mg total) by mouth daily. 30 capsule 5  . sildenafil (REVATIO) 20 MG tablet Take 1 tablet (20 mg total) by mouth 3 (three) times daily. 35 tablet 5   No  facility-administered medications prior to visit.     No Known Allergies  Review of Systems  Constitutional: Negative for fever and malaise/fatigue.  HENT: Negative for congestion.   Eyes: Negative for blurred vision.  Respiratory: Negative for shortness of breath.   Cardiovascular: Negative for chest pain, palpitations and leg swelling.  Gastrointestinal: Negative for abdominal pain, blood in stool and nausea.  Genitourinary: Negative for dysuria and frequency.  Musculoskeletal: Positive for joint pain. Negative for falls.  Skin: Negative for rash.  Neurological: Negative for dizziness, loss of consciousness and headaches.  Endo/Heme/Allergies: Negative for environmental allergies.  Psychiatric/Behavioral: Negative for depression. The patient is not nervous/anxious.        Objective:    Physical Exam  Constitutional: He is oriented to person, place, and time. He appears well-developed and well-nourished. No distress.  HENT:  Head: Normocephalic and atraumatic.  Nose: Nose normal.  Eyes: Right eye exhibits no discharge. Left eye exhibits no discharge.  Neck: Normal range of motion. Neck supple.  Cardiovascular: Normal rate and regular rhythm.  No murmur heard. Pulmonary/Chest: Effort normal and breath sounds normal.  Abdominal: Soft. Bowel sounds are normal. There is no tenderness.  Musculoskeletal: He exhibits no edema.  Neurological: He is alert and oriented to person, place, and time.  Skin: Skin is warm and dry.  Psychiatric: He has a normal mood and affect.  Nursing note and vitals reviewed.   BP 121/74 (BP Location: Left Arm, Patient Position: Sitting, Cuff Size: Normal)   Pulse 78   Temp 98.1 F (36.7 C) (Oral)   Ht 5\' 10"  (1.778 m)   Wt 225 lb 9.6 oz (102.3 kg)   SpO2 97%   BMI 32.37 kg/m  Wt Readings from Last 3 Encounters:  01/14/18 225 lb 9.6 oz (102.3 kg)  07/09/17 229 lb 12.8 oz (104.2 kg)  06/30/16 238 lb (108 kg)     Lab Results  Component  Value Date   WBC 5.1 01/14/2018   HGB 15.2 01/14/2018   HCT 44.4 01/14/2018   PLT 266.0 01/14/2018   GLUCOSE 91 01/14/2018   CHOL 121 01/14/2018   TRIG 115.0 01/14/2018   HDL 38.90 (L) 01/14/2018   LDLDIRECT 105.0 06/30/2016   LDLCALC 59 01/14/2018   ALT 29 01/14/2018   AST 22 01/14/2018   NA 140 01/14/2018   K 4.5 01/14/2018   CL  103 01/14/2018   CREATININE 1.07 01/14/2018   BUN 13 01/14/2018   CO2 30 01/14/2018   TSH 1.20 01/14/2018    Lab Results  Component Value Date   TSH 1.20 01/14/2018   Lab Results  Component Value Date   WBC 5.1 01/14/2018   HGB 15.2 01/14/2018   HCT 44.4 01/14/2018   MCV 87.6 01/14/2018   PLT 266.0 01/14/2018   Lab Results  Component Value Date   NA 140 01/14/2018   K 4.5 01/14/2018   CO2 30 01/14/2018   GLUCOSE 91 01/14/2018   BUN 13 01/14/2018   CREATININE 1.07 01/14/2018   BILITOT 0.8 01/14/2018   ALKPHOS 43 01/14/2018   AST 22 01/14/2018   ALT 29 01/14/2018   PROT 6.9 01/14/2018   ALBUMIN 4.4 01/14/2018   CALCIUM 9.7 01/14/2018   GFR 77.96 01/14/2018   Lab Results  Component Value Date   CHOL 121 01/14/2018   Lab Results  Component Value Date   HDL 38.90 (L) 01/14/2018   Lab Results  Component Value Date   LDLCALC 59 01/14/2018   Lab Results  Component Value Date   TRIG 115.0 01/14/2018   Lab Results  Component Value Date   CHOLHDL 3 01/14/2018   No results found for: HGBA1C     Assessment & Plan:   Problem List Items Addressed This Visit    Dyslipidemia    Encouraged heart healthy diet, increase exercise, avoid trans fats, consider a krill oil cap daily      Relevant Medications   atorvastatin (LIPITOR) 10 MG tablet   Other Relevant Orders   TSH (Completed)   Gout    Hydrate and monitor      Relevant Orders   Uric acid (Completed)   TSH (Completed)   Renal lithiasis - Primary    Hydrate with lemon water and avoid offending foods.       Relevant Medications   allopurinol (ZYLOPRIM) 300 MG  tablet   Other Relevant Orders   CBC (Completed)   Comprehensive metabolic panel (Completed)   TSH (Completed)   Right hip pain    Encouraged moist heat and gentle stretching as tolerated. May try NSAIDs and prescription meds as directed and report if symptoms worsen or seek immediate care. ty muscle relaxers qhs prn.        Other Visit Diagnoses    Hyperlipidemia, unspecified hyperlipidemia type       Relevant Medications   atorvastatin (LIPITOR) 10 MG tablet   sildenafil (REVATIO) 20 MG tablet   Other Relevant Orders   Lipid panel (Completed)      I have discontinued Hurshell Bruss's naproxen and methocarbamol. I am also having him start on methylPREDNISolone sodium succinate. Additionally, I am having him maintain his NON FORMULARY, fexofenadine, fluticasone, KRILL OIL PO, indomethacin, allopurinol, atorvastatin, omeprazole, and sildenafil.  Meds ordered this encounter  Medications  . allopurinol (ZYLOPRIM) 300 MG tablet    Sig: Take 1 tablet (300 mg total) by mouth daily.    Dispense:  90 tablet    Refill:  3  . atorvastatin (LIPITOR) 10 MG tablet    Sig: Take 1 tablet (10 mg total) by mouth daily.    Dispense:  90 tablet    Refill:  5  . omeprazole (PRILOSEC) 20 MG capsule    Sig: Take 1 capsule (20 mg total) by mouth daily.    Dispense:  30 capsule    Refill:  5  . sildenafil (REVATIO) 20 MG tablet  Sig: Take 1 tablet (20 mg total) by mouth 3 (three) times daily.    Dispense:  35 tablet    Refill:  5  . methylPREDNISolone sodium succinate (SOLU-MEDROL) 125 mg/2 mL injection    Sig: Inject 2 mLs (125 mg total) into the muscle once for 1 dose.    Dispense:  2 mL    Refill:  0     Penni Homans, MD

## 2018-01-16 NOTE — Assessment & Plan Note (Signed)
Encouraged moist heat and gentle stretching as tolerated. May try NSAIDs and prescription meds as directed and report if symptoms worsen or seek immediate care. ty muscle relaxers qhs prn.

## 2018-03-30 ENCOUNTER — Encounter: Payer: Self-pay | Admitting: Family Medicine

## 2018-05-18 DIAGNOSIS — L089 Local infection of the skin and subcutaneous tissue, unspecified: Secondary | ICD-10-CM | POA: Diagnosis not present

## 2018-06-24 ENCOUNTER — Other Ambulatory Visit: Payer: Self-pay | Admitting: Family Medicine

## 2018-07-22 ENCOUNTER — Ambulatory Visit (INDEPENDENT_AMBULATORY_CARE_PROVIDER_SITE_OTHER): Payer: 59 | Admitting: Family Medicine

## 2018-07-22 DIAGNOSIS — M109 Gout, unspecified: Secondary | ICD-10-CM | POA: Diagnosis not present

## 2018-07-22 DIAGNOSIS — Z23 Encounter for immunization: Secondary | ICD-10-CM

## 2018-07-22 DIAGNOSIS — Z0000 Encounter for general adult medical examination without abnormal findings: Secondary | ICD-10-CM | POA: Diagnosis not present

## 2018-07-22 DIAGNOSIS — E6609 Other obesity due to excess calories: Secondary | ICD-10-CM

## 2018-07-22 DIAGNOSIS — E785 Hyperlipidemia, unspecified: Secondary | ICD-10-CM

## 2018-07-22 LAB — LIPID PANEL
Cholesterol: 132 mg/dL (ref 0–200)
HDL: 36.1 mg/dL — ABNORMAL LOW (ref >39.00)
LDL Cholesterol: 59 mg/dL (ref 0–99)
NonHDL: 95.85
Total CHOL/HDL Ratio: 4
Triglycerides: 185 mg/dL — ABNORMAL HIGH (ref 0.0–149.0)
VLDL: 37 mg/dL (ref 0.0–40.0)

## 2018-07-22 LAB — CBC
HCT: 46.1 % (ref 39.0–52.0)
Hemoglobin: 15.7 g/dL (ref 13.0–17.0)
MCHC: 34.1 g/dL (ref 30.0–36.0)
MCV: 87 fl (ref 78.0–100.0)
Platelets: 271 10*3/uL (ref 150.0–400.0)
RBC: 5.3 Mil/uL (ref 4.22–5.81)
RDW: 13.7 % (ref 11.5–15.5)
WBC: 5.5 10*3/uL (ref 4.0–10.5)

## 2018-07-22 LAB — COMPREHENSIVE METABOLIC PANEL
ALT: 32 U/L (ref 0–53)
AST: 21 U/L (ref 0–37)
Albumin: 4.5 g/dL (ref 3.5–5.2)
Alkaline Phosphatase: 41 U/L (ref 39–117)
BUN: 14 mg/dL (ref 6–23)
CO2: 28 mEq/L (ref 19–32)
CREATININE: 1.02 mg/dL (ref 0.40–1.50)
Calcium: 9.7 mg/dL (ref 8.4–10.5)
Chloride: 102 mEq/L (ref 96–112)
GFR: 77.35 mL/min (ref >60.00)
Glucose, Bld: 79 mg/dL (ref 70–99)
Potassium: 4.2 mEq/L (ref 3.5–5.1)
Sodium: 139 mEq/L (ref 135–145)
Total Bilirubin: 0.7 mg/dL (ref 0.2–1.2)
Total Protein: 6.9 g/dL (ref 6.0–8.3)

## 2018-07-22 LAB — TSH: TSH: 1.98 u[IU]/mL (ref 0.35–4.50)

## 2018-07-22 LAB — URIC ACID: Uric Acid, Serum: 6.1 mg/dL (ref 4.0–7.8)

## 2018-07-22 MED ORDER — INDOMETHACIN 50 MG PO CAPS: 50.0000 mg | ORAL_CAPSULE | Freq: Two times a day (BID) | ORAL | 1 refills | Status: DC | PRN

## 2018-07-22 MED ORDER — ALLOPURINOL 300 MG PO TABS: 300.0000 mg | ORAL_TABLET | Freq: Every day | ORAL | 3 refills | Status: DC

## 2018-07-22 MED ORDER — ATORVASTATIN CALCIUM 10 MG PO TABS: 10.0000 mg | ORAL_TABLET | Freq: Every day | ORAL | 5 refills | Status: DC

## 2018-07-22 NOTE — Assessment & Plan Note (Signed)
Patient encouraged to maintain heart healthy diet, regular exercise, adequate sleep. Consider daily probiotics. Take medications as prescribed 

## 2018-07-22 NOTE — Progress Notes (Signed)
Subjective:    Patient ID: Michael Howe, male    DOB: 03/18/1971, 48 y.o.   MRN: [id number]  No chief complaint on file.   HPI Patient is in today for annual preventative exam.  He feels well today.  He has generally been staying busy and eating a heart healthy diet.  He did have one gout flare over the summer with dehydration but it is not been a frequent occurrence.  There is been no recent febrile illness or hospitalization.  He manages activities of daily living well.  He is maintaining a heart healthy diet and trying to exercise regularly. Denies CP/palp/SOB/HA/congestion/fevers/GI or GU c/o. Taking meds as prescribed  Past Medical History:  Diagnosis Date  . Allergic state 02/01/2012  . Allergy   . Cholelithiasis 03/10/2015  . Dyslipidemia 07/26/2013  . Gout   . Obesity 06/30/2016  . Preventative health care 07/14/2012  . Renal lithiasis 11/10/2015  . Umbilical hernia 1/44/8185    Past Surgical History:  Procedure Laterality Date  . TONSILLECTOMY AND ADENOIDECTOMY  1975    Family History  Problem Relation Age of Onset  . Gout Maternal Grandmother   . Heart disease Maternal Grandmother   . Cancer Maternal Grandmother   . Hyperlipidemia Mother   . Diabetes Mother   . Bowel Disease Mother        IBS  . Heart disease Sister   . Autism Son   . Stroke Paternal Grandmother   . Alcohol abuse Father     Social History   Socioeconomic History  . Marital status: Married    Spouse name: Not on file  . Number of children: Not on file  . Years of education: Not on file  . Highest education level: Not on file  Occupational History  . Not on file  Social Needs  . Financial resource strain: Not on file  . Food insecurity:    Worry: Not on file    Inability: Not on file  . Transportation needs:    Medical: Not on file    Non-medical: Not on file  Tobacco Use  . Smoking status: Never Smoker  . Smokeless tobacco: Never Used  Substance and Sexual Activity  . Alcohol use:  No  . Drug use: No  . 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 Physical activity:    Days per week: Not on file    Minutes per session: Not on file  . Stress: Not on file  Relationships  . Social connections:    Talks on phone: Not on file    Gets together: Not on file    Attends religious service: Not on file    Active member of club or organization: Not on file    Attends meetings of clubs or organizations: Not on file    Relationship status: Not on file  . Intimate partner violence:    Fear of current or ex partner: Not on file    Emotionally abused: Not on file    Physically abused: Not on file    Forced sexual activity: Not on file  Other Topics Concern  . Not on file  Social History Narrative  . Not on file    Outpatient Medications Prior to Visit  Medication Sig Dispense Refill  . fexofenadine (ALLEGRA) 180 MG tablet Take 1 tablet (180 mg total) by mouth daily as needed for allergies or rhinitis.    Marland Kitchen  fluticasone (FLONASE) 50 MCG/ACT nasal spray Place 2 sprays into both nostrils daily as needed for allergies or rhinitis. 16 g 2  . KRILL OIL PO Take by mouth.    . NON FORMULARY Life way Kefir- drink 8 oz daily    . sildenafil (REVATIO) 20 MG tablet TAKE ONE TABLET BY MOUTH THREE TIMES A DAY 35 tablet 4  . allopurinol (ZYLOPRIM) 300 MG tablet Take 1 tablet (300 mg total) by mouth daily. 90 tablet 3  . atorvastatin (LIPITOR) 10 MG tablet Take 1 tablet (10 mg total) by mouth daily. 90 tablet 5  . indomethacin (INDOCIN) 50 MG capsule Take 1 capsule (50 mg total) by mouth 2 (two) times daily as needed for moderate pain. 60 capsule 1  . methylPREDNISolone (MEDROL) 4 MG tablet 5 tab po qd X 1d then 4 tab po qd X 1d then 3 tab po qd X 1d then 2 tab po qd then 1 tab po qd 15 tablet 0  . omeprazole (PRILOSEC) 20 MG capsule Take 1 capsule (20 mg total) by mouth daily. 30 capsule 5   No facility-administered  medications prior to visit.     No Known Allergies  Review of Systems  Constitutional: Negative for fever and malaise/fatigue.  HENT: Negative for congestion.   Eyes: Negative for blurred vision.  Respiratory: Negative for shortness of breath.   Cardiovascular: Negative for chest pain, palpitations and leg swelling.  Gastrointestinal: Negative for abdominal pain, blood in stool and nausea.  Genitourinary: Negative for dysuria and frequency.  Musculoskeletal: Negative for falls.  Skin: Negative for rash.  Neurological: Negative for dizziness, loss of consciousness and headaches.  Endo/Heme/Allergies: Negative for environmental allergies.  Psychiatric/Behavioral: Negative for depression. The patient is not nervous/anxious.        Objective:    Physical Exam Vitals signs and nursing note reviewed.  Constitutional:      General: He is not in acute distress.    Appearance: He is well-developed.  HENT:     Head: Normocephalic and atraumatic.     Nose: Nose normal.  Eyes:     General:        Right eye: No discharge.        Left eye: No discharge.  Neck:     Musculoskeletal: Normal range of motion and neck supple.  Cardiovascular:     Rate and Rhythm: Normal rate and regular rhythm.     Heart sounds: No murmur.  Pulmonary:     Effort: Pulmonary effort is normal.     Breath sounds: Normal breath sounds.  Abdominal:     General: Bowel sounds are normal.     Palpations: Abdomen is soft.     Tenderness: There is no abdominal tenderness.  Skin:    General: Skin is warm and dry.  Neurological:     Mental Status: He is alert and oriented to person, place, and time.     BP 98/76 (BP Location: Left Arm, Patient Position: Sitting, Cuff Size: Normal)   Pulse 74   Temp 97.6 F (36.4 C) (Oral)   Resp 18   Ht 5\' 10"  (1.778 m)   Wt 229 lb 9.6 oz (104.1 kg)   SpO2 98%   BMI 32.94 kg/m  Wt Readings from Last 3 Encounters:  07/22/18 229 lb 9.6 oz (104.1 kg)  01/14/18 225 lb  9.6 oz (102.3 kg)  07/09/17 229 lb 12.8 oz (104.2 kg)     Lab Results  Component Value Date  WBC 5.5 07/22/2018   HGB 15.7 07/22/2018   HCT 46.1 07/22/2018   PLT 271.0 07/22/2018   GLUCOSE 79 07/22/2018   CHOL 132 07/22/2018   TRIG 185.0 (H) 07/22/2018   HDL 36.10 (L) 07/22/2018   LDLDIRECT 105.0 06/30/2016   LDLCALC 59 07/22/2018   ALT 32 07/22/2018   AST 21 07/22/2018   NA 139 07/22/2018   K 4.2 07/22/2018   CL 102 07/22/2018   CREATININE 1.02 07/22/2018   BUN 14 07/22/2018   CO2 28 07/22/2018   TSH 1.98 07/22/2018    Lab Results  Component Value Date   TSH 1.98 07/22/2018   Lab Results  Component Value Date   WBC 5.5 07/22/2018   HGB 15.7 07/22/2018   HCT 46.1 07/22/2018   MCV 87.0 07/22/2018   PLT 271.0 07/22/2018   Lab Results  Component Value Date   NA 139 07/22/2018   K 4.2 07/22/2018   CO2 28 07/22/2018   GLUCOSE 79 07/22/2018   BUN 14 07/22/2018   CREATININE 1.02 07/22/2018   BILITOT 0.7 07/22/2018   ALKPHOS 41 07/22/2018   AST 21 07/22/2018   ALT 32 07/22/2018   PROT 6.9 07/22/2018   ALBUMIN 4.5 07/22/2018   CALCIUM 9.7 07/22/2018   GFR 77.35 07/22/2018   Lab Results  Component Value Date   CHOL 132 07/22/2018   Lab Results  Component Value Date   HDL 36.10 (L) 07/22/2018   Lab Results  Component Value Date   LDLCALC 59 07/22/2018   Lab Results  Component Value Date   TRIG 185.0 (H) 07/22/2018   Lab Results  Component Value Date   CHOLHDL 4 07/22/2018   No results found for: HGBA1C     Assessment & Plan:   Problem List Items Addressed This Visit    Dyslipidemia    Encouraged heart healthy diet, increase exercise, avoid trans fats, consider a krill oil cap daily      Relevant Medications   atorvastatin (LIPITOR) 10 MG tablet   Other Relevant Orders   Lipid panel (Completed)   Gout    No recent flare, last flare in the summer, monitor and hydrate      Relevant Orders   Uric acid (Completed)   Preventative health  care (Chronic)    Patient encouraged to maintain heart healthy diet, regular exercise, adequate sleep. Consider daily probiotics. Take medications as prescribed      Relevant Orders   CBC (Completed)   Comprehensive metabolic panel (Completed)   TSH (Completed)   Obesity    Encouraged DASH diet, decrease po intake and increase exercise as tolerated. Needs 7-8 hours of sleep nightly. Avoid trans fats, eat small, frequent meals every 4-5 hours with lean proteins, complex carbs and healthy fats. Minimize simple carbs         I have discontinued Erven Fina's omeprazole and methylPREDNISolone. I am also having him maintain his NON FORMULARY, fexofenadine, fluticasone, KRILL OIL PO, sildenafil, allopurinol, atorvastatin, and indomethacin.  Meds ordered this encounter  Medications  . allopurinol (ZYLOPRIM) 300 MG tablet    Sig: Take 1 tablet (300 mg total) by mouth daily.    Dispense:  90 tablet    Refill:  3  . atorvastatin (LIPITOR) 10 MG tablet    Sig: Take 1 tablet (10 mg total) by mouth daily.    Dispense:  90 tablet    Refill:  5  . indomethacin (INDOCIN) 50 MG capsule    Sig: Take 1 capsule (50  mg total) by mouth 2 (two) times daily as needed for moderate pain.    Dispense:  60 capsule    Refill:  1     Penni Homans, MD

## 2018-07-22 NOTE — Assessment & Plan Note (Signed)
Encouraged DASH diet, decrease po intake and increase exercise as tolerated. Needs 7-8 hours of sleep nightly. Avoid trans fats, eat small, frequent meals every 4-5 hours with lean proteins, complex carbs and healthy fats. Minimize simple carbs 

## 2018-07-22 NOTE — Assessment & Plan Note (Addendum)
No recent flare, last flare in the summer, monitor and hydrate

## 2018-07-22 NOTE — Assessment & Plan Note (Signed)
Encouraged heart healthy diet, increase exercise, avoid trans fats, consider a krill oil cap daily 

## 2018-07-22 NOTE — Patient Instructions (Addendum)
At 48 consider Shingrix shot, the new shingles shot is 2 shots over 2-6 months check with insurance and confirm they will cover it and then can get a nurse appointment to get the first shot. Tetanus  Colonoscopy at 56   Cholesterol Cholesterol is a white, waxy, fat-like substance that is needed by the human body in small amounts. The liver makes all the cholesterol we need. Cholesterol is carried from the liver by the blood through the blood vessels. Deposits of cholesterol (plaques) may build up on blood vessel (artery) walls. Plaques make the arteries narrower and stiffer. Cholesterol plaques increase the risk for heart attack and stroke. You cannot feel your cholesterol level even if it is very high. The only way to know that it is high is to have a blood test. Once you know your cholesterol levels, you should keep a record of the test results. Work with your health care provider to keep your levels in the desired range. What do the results mean?  Total cholesterol is a rough measure of all the cholesterol in your blood.  LDL (low-density lipoprotein) is the "bad" cholesterol. This is the type that causes plaque to build up on the artery walls. You want this level to be low.  HDL (high-density lipoprotein) is the "good" cholesterol because it cleans the arteries and carries the LDL away. You want this level to be high.  Triglycerides are fat that the body can either burn for energy or store. High levels are closely linked to heart disease. What are the desired levels of cholesterol?  Total cholesterol below 200.  LDL below 100 for people who are at risk, below 70 for people at very high risk.  HDL above 40 is good. A level of 60 or higher is considered to be protective against heart disease.  Triglycerides below 150. How can I lower my cholesterol? Diet Follow your diet program as told by your health care provider.  Choose fish or white meat chicken and [place], roasted or baked.  Limit fatty cuts of red meat, fried foods, and processed meats, such as sausage and lunch meats.  Eat lots of fresh fruits and vegetables.  Choose whole grains, beans, pasta, potatoes, and cereals.  Choose olive oil, corn oil, or canola oil, and use only small amounts.  Avoid butter, mayonnaise, shortening, or palm kernel oils.  Avoid foods with trans fats.  Drink skim or nonfat milk and eat low-fat or nonfat yogurt and cheeses. Avoid whole milk, cream, ice cream, egg yolks, and full-fat cheeses.  Healthier desserts include angel food cake, ginger snaps, animal crackers, hard candy, popsicles, and low-fat or nonfat frozen yogurt. Avoid pastries, cakes, pies, and cookies.  Exercise  Follow your exercise program as told by your health care provider. A regular program: ? Helps to decrease LDL and raise HDL. ? Helps with weight control.  Do things that increase your activity level, such as gardening, walking, and taking the stairs.  Ask your health care provider about ways that you can be more active in your daily life. Medicine  Take over-the-counter and prescription medicines only as told by your health care provider. ? Medicine may be prescribed by your health care provider to help lower cholesterol and decrease the risk for heart disease. This is usually done if diet and exercise have failed to bring down cholesterol levels. ? If you have several risk factors, you may need medicine even if your levels are normal. This information is not intended to replace  advice given to you by your health care provider. Make sure you discuss any questions you have with your health care provider. Document Released: 02/17/2001 Document Revised: 12/21/2015 Document Reviewed: 11/23/2015 Elsevier Interactive Patient Education  2019 [place] Years, Male Preventive care refers to lifestyle choices and visits with your health care provider that can promote health and  wellness. What does preventive care include?   A yearly physical exam. This is also called an annual well check.  Dental exams once or twice a year.  Routine eye exams. Ask your health care provider how often you should have your eyes checked.  Personal lifestyle choices, including: ? Daily care of your teeth and gums. ? Regular physical activity. ? Eating a healthy diet. ? Avoiding tobacco and drug use. ? Limiting alcohol use. ? Practicing safe sex. ? Taking low-dose aspirin every day starting at age 46. What happens during an annual well check? The services and screenings done by your health care provider during your annual well check will depend on your age, overall health, lifestyle risk factors, and family history of disease. Counseling Your health care provider may ask you questions about your:  Alcohol use.  Tobacco use.  Drug use.  Emotional well-being.  Home and relationship well-being.  Sexual activity.  Eating habits.  Work and work Statistician. Screening You may have the following tests or measurements:  Height, weight, and BMI.  Blood pressure.  Lipid and cholesterol levels. These may be checked every 5 years, or more frequently if you are over 58 years old.  Skin check.  Lung cancer screening. You may have this screening every year starting at age 68 if you have a 30-pack-year history of smoking and currently smoke or have quit within the past 15 years.  Colorectal cancer screening. All adults should have this screening starting at age 70 and continuing until age 59. Your health care provider may recommend screening at age 49. You will have tests every 1-10 years, depending on your results and the type of screening test. People at increased risk should start screening at an earlier age. Screening tests may include: ? Guaiac-based fecal occult blood testing. ? Fecal immunochemical test (FIT). ? Stool DNA test. ? Virtual  colonoscopy. ? Sigmoidoscopy. During this test, a flexible tube with a tiny camera (sigmoidoscope) is used to examine your rectum and lower colon. The sigmoidoscope is inserted through your anus into your rectum and lower colon. ? Colonoscopy. During this test, a long, thin, flexible tube with a tiny camera (colonoscope) is used to examine your entire colon and rectum.  Prostate cancer screening. Recommendations will vary depending on your family history and other risks.  Hepatitis C blood test.  Hepatitis B blood test.  Sexually transmitted disease (STD) testing.  Diabetes screening. This is done by checking your blood sugar (glucose) after you have not eaten for a while (fasting). You may have this done every 1-3 years. Discuss your test results, treatment options, and if necessary, the need for more tests with your health care provider. Vaccines Your health care provider may recommend certain vaccines, such as:  Influenza vaccine. This is recommended every year.  Tetanus, diphtheria, and acellular pertussis (Tdap, Td) vaccine. You may need a Td booster every 10 years.  Varicella vaccine. You may need this if you have not been vaccinated.  Zoster vaccine. You may need this after age 76.  Measles, mumps, and rubella (MMR) vaccine. You may need at least one dose of  MMR if you were born in 1957 or later. You may also need a second dose.  Pneumococcal 13-valent conjugate (PCV13) vaccine. You may need this if you have certain conditions and have not been vaccinated.  Pneumococcal polysaccharide (PPSV23) vaccine. You may need one or two doses if you smoke cigarettes or if you have certain conditions.  Meningococcal vaccine. You may need this if you have certain conditions.  Hepatitis A vaccine. You may need this if you have certain conditions or if you travel or work in places where you may be exposed to hepatitis A.  Hepatitis B vaccine. You may need this if you have certain  conditions or if you travel or work in places where you may be exposed to hepatitis B.  Haemophilus influenzae type b (Hib) vaccine. You may need this if you have certain risk factors. Talk to your health care provider about which screenings and vaccines you need and how often you need them. This information is not intended to replace advice given to you by your health care provider. Make sure you discuss any questions you have with your health care provider. Document Released: 06/21/2015 Document Revised: 07/15/2017 Document Reviewed: 03/26/2015 Elsevier Interactive Patient Education  2019 [employer].

## 2019-01-20 ENCOUNTER — Telehealth: Payer: Self-pay | Admitting: Family Medicine

## 2019-01-20 ENCOUNTER — Ambulatory Visit (INDEPENDENT_AMBULATORY_CARE_PROVIDER_SITE_OTHER): Payer: 59 | Admitting: Family Medicine

## 2019-01-20 DIAGNOSIS — E6609 Other obesity due to excess calories: Secondary | ICD-10-CM

## 2019-01-20 DIAGNOSIS — Z7189 Other specified counseling: Secondary | ICD-10-CM

## 2019-01-20 DIAGNOSIS — M1A9XX0 Chronic gout, unspecified, without tophus (tophi): Secondary | ICD-10-CM

## 2019-01-20 DIAGNOSIS — E785 Hyperlipidemia, unspecified: Secondary | ICD-10-CM | POA: Diagnosis not present

## 2019-01-20 NOTE — Telephone Encounter (Signed)
Unable to LVM to schedule 6 month f/u appointment from [facility] 01/20/19.

## 2019-01-22 DIAGNOSIS — Z7189 Other specified counseling: Secondary | ICD-10-CM | POA: Insufficient documentation

## 2019-01-22 NOTE — Assessment & Plan Note (Signed)
No c/o recent flares 

## 2019-01-22 NOTE — Assessment & Plan Note (Signed)
Encouraged heart healthy diet, increase exercise, avoid trans fats and simple carbs.  

## 2019-01-22 NOTE — Assessment & Plan Note (Signed)
Encouraged to maintain masking and social distancing. Get pulse oximeter and monitor vitals weekly and prn and report any symptoms for testing.

## 2019-01-22 NOTE — Progress Notes (Signed)
Virtual Visit via Video Note  I connected with Michael Howe on 01/20/19 at  1:00 PM EDT by a video enabled telemedicine application and verified that I am speaking with the correct person using two identifiers.  Location: Patient: home Provider: home   I discussed the limitations of evaluation and management by telemedicine and the availability of in person appointments. The patient expressed understanding and agreed to proceed. Magdalene Molly, CMA was able to get patient set up on visit, video    Subjective:    Patient ID: Michael Howe, male    DOB: Sep 22, 1970, 48 y.o.   MRN: [id number]  No chief complaint on file.   HPI Patient is in today for follow up on chronic medical concerns including gout, elevated BMI, dyslipidemia and more. No recent febrile illness or hospitalizations. No recent flares of his gout. Is trying to quarantine well. Staying active. Denies CP/palp/SOB/HA/congestion/fevers/GI or GU c/o. Taking meds as prescribed  Past Medical History:  Diagnosis Date  . Allergic state 02/01/2012  . Allergy   . Cholelithiasis 03/10/2015  . Dyslipidemia 07/26/2013  . Gout   . Obesity 06/30/2016  . Preventative health care 07/14/2012  . Renal lithiasis 11/10/2015  . Umbilical hernia 09/08/4740    Past Surgical History:  Procedure Laterality Date  . TONSILLECTOMY AND ADENOIDECTOMY  1975    Family History  Problem Relation Age of Onset  . Gout Maternal Grandmother   . Heart disease Maternal Grandmother   . Cancer Maternal Grandmother   . Hyperlipidemia Mother   . Diabetes Mother   . Bowel Disease Mother        IBS  . Heart disease Sister   . Autism Son   . Stroke Paternal Grandmother   . Alcohol abuse Father     Social History   Socioeconomic History  . Marital status: Married    Spouse name: Not on file  . Number of children: Not on file  . Years of education: Not on file  . Highest education level: Not on file  Occupational History  . Not on file  Social  Needs  . Financial resource strain: Not on file  . Food insecurity    Worry: Not on file    Inability: Not on file  . Transportation needs    Medical: Not on file    Non-medical: Not on file  Tobacco Use  . Smoking status: Never Smoker  . Smokeless tobacco: Never Used  Substance and Sexual Activity  . Alcohol use: No  . Drug use: No  . 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 Physical activity    Days per week: Not on file    Minutes per session: Not on file  . Stress: Not on file  Relationships  . Social Herbalist on phone: Not on file    Gets together: Not on file    Attends religious service: Not on file    Active member of club or organization: Not on file    Attends meetings of clubs or organizations: Not on file    Relationship status: Not on file  . Intimate partner violence    Fear of current or ex partner: Not on file    Emotionally abused: Not on file    Physically abused: Not on file    Forced sexual activity: Not on file  Other Topics Concern  . Not on file  Social History Narrative  . Not on file    Outpatient Medications Prior to Visit  Medication Sig Dispense Refill  . allopurinol (ZYLOPRIM) 300 MG tablet Take 1 tablet (300 mg total) by mouth daily. 90 tablet 3  . atorvastatin (LIPITOR) 10 MG tablet Take 1 tablet (10 mg total) by mouth daily. 90 tablet 5  . fexofenadine (ALLEGRA) 180 MG tablet Take 1 tablet (180 mg total) by mouth daily as needed for allergies or rhinitis.    . fluticasone (FLONASE) 50 MCG/ACT nasal spray Place 2 sprays into both nostrils daily as needed for allergies or rhinitis. 16 g 2  . indomethacin (INDOCIN) 50 MG capsule Take 1 capsule (50 mg total) by mouth 2 (two) times daily as needed for moderate pain. 60 capsule 1  . KRILL OIL PO Take by mouth.    . NON FORMULARY Life way Kefir- drink 8 oz daily    . sildenafil (REVATIO) 20 MG tablet TAKE ONE  TABLET BY MOUTH THREE TIMES A DAY 35 tablet 4   No facility-administered medications prior to visit.     No Known Allergies  Review of Systems  Constitutional: Negative for fever and malaise/fatigue.  HENT: Negative for congestion.   Eyes: Negative for blurred vision.  Respiratory: Negative for shortness of breath.   Cardiovascular: Negative for chest pain, palpitations and leg swelling.  Gastrointestinal: Negative for abdominal pain, blood in stool and nausea.  Genitourinary: Negative for dysuria and frequency.  Musculoskeletal: Negative for falls.  Skin: Negative for rash.  Neurological: Negative for dizziness, loss of consciousness and headaches.  Endo/Heme/Allergies: Negative for environmental allergies.  Psychiatric/Behavioral: Negative for depression. The patient is not nervous/anxious.        Objective:    Physical Exam Constitutional:      Appearance: Normal appearance. He is not ill-appearing.  HENT:     Head: Normocephalic and atraumatic.     Nose: Nose normal.  Pulmonary:     Effort: Pulmonary effort is normal.  Neurological:     Mental Status: He is alert and oriented to person, place, and time.  Psychiatric:        Mood and Affect: Mood normal.        Behavior: Behavior normal.     There were no vitals taken for this visit. Wt Readings from Last 3 Encounters:  07/22/18 229 lb 9.6 oz (104.1 kg)  01/14/18 225 lb 9.6 oz (102.3 kg)  07/09/17 229 lb 12.8 oz (104.2 kg)    Diabetic Foot Exam - Simple   No data filed     Lab Results  Component Value Date   WBC 5.5 07/22/2018   HGB 15.7 07/22/2018   HCT 46.1 07/22/2018   PLT 271.0 07/22/2018   GLUCOSE 79 07/22/2018   CHOL 132 07/22/2018   TRIG 185.0 (H) 07/22/2018   HDL 36.10 (L) 07/22/2018   LDLDIRECT 105.0 06/30/2016   LDLCALC 59 07/22/2018   ALT 32 07/22/2018   AST 21 07/22/2018   NA 139 07/22/2018   K 4.2 07/22/2018   CL 102 07/22/2018   CREATININE 1.02 07/22/2018   BUN 14 07/22/2018    CO2 28 07/22/2018   TSH 1.98 07/22/2018    Lab Results  Component Value Date   TSH 1.98 07/22/2018   Lab Results  Component Value Date   WBC 5.5 07/22/2018   HGB 15.7 07/22/2018   HCT 46.1 07/22/2018   MCV 87.0 07/22/2018   PLT 271.0 07/22/2018   Lab Results  Component Value  Date   NA 139 07/22/2018   K 4.2 07/22/2018   CO2 28 07/22/2018   GLUCOSE 79 07/22/2018   BUN 14 07/22/2018   CREATININE 1.02 07/22/2018   BILITOT 0.7 07/22/2018   ALKPHOS 41 07/22/2018   AST 21 07/22/2018   ALT 32 07/22/2018   PROT 6.9 07/22/2018   ALBUMIN 4.5 07/22/2018   CALCIUM 9.7 07/22/2018   GFR 77.35 07/22/2018   Lab Results  Component Value Date   CHOL 132 07/22/2018   Lab Results  Component Value Date   HDL 36.10 (L) 07/22/2018   Lab Results  Component Value Date   LDLCALC 59 07/22/2018   Lab Results  Component Value Date   TRIG 185.0 (H) 07/22/2018   Lab Results  Component Value Date   CHOLHDL 4 07/22/2018   No results found for: HGBA1C     Assessment & Plan:   Problem List Items Addressed This Visit    Dyslipidemia    Encouraged heart healthy diet, increase exercise, avoid trans fats and simple carbs      Gout    No c/o recent flares      Obesity    Heart healthy diet such as DASH diet or Weight Watchers APP, decrease po intake and increase exercise as tolerated. Needs 7-8 hours of sleep nightly. Avoid trans fats, eat small, frequent meals every 4-5 hours with lean proteins, complex carbs and healthy fats. Minimize simple carbs      Educated About Covid-19 Virus Infection    Encouraged to maintain masking and social distancing. Get pulse oximeter and monitor vitals weekly and prn and report any symptoms for testing.          I am having Dontea Corlew maintain his NON FORMULARY, fexofenadine, fluticasone, KRILL OIL PO, sildenafil, allopurinol, atorvastatin, and indomethacin.  No orders of the defined types were placed in this encounter.    I discussed  the assessment and treatment plan with the patient. The patient was provided an opportunity to ask questions and all were answered. The patient agreed with the plan and demonstrated an understanding of the instructions.   The patient was advised to call back or seek an in-person evaluation if the symptoms worsen or if the condition fails to improve as anticipated.  I provided 25 minutes of non-face-to-face time during this encounter.   Penni Homans, MD

## 2019-01-22 NOTE — Assessment & Plan Note (Signed)
Heart healthy diet such as DASH diet or Weight Watchers APP, decrease po intake and increase exercise as tolerated. Needs 7-8 hours of sleep nightly. Avoid trans fats, eat small, frequent meals every 4-5 hours with lean proteins, complex carbs and healthy fats. Minimize simple carbs

## 2019-05-30 ENCOUNTER — Encounter: Payer: Self-pay | Admitting: Medical

## 2019-05-30 ENCOUNTER — Ambulatory Visit (HOSPITAL_BASED_OUTPATIENT_CLINIC_OR_DEPARTMENT_OTHER)
Admission: RE | Admit: 2019-05-30 | Discharge: 2019-05-30 | Disposition: A | Discharge status: Home / Self Care | Payer: 59 | Source: Ambulatory Visit | Attending: Medical | Admitting: Medical

## 2019-05-30 ENCOUNTER — Ambulatory Visit (INDEPENDENT_AMBULATORY_CARE_PROVIDER_SITE_OTHER): Payer: 59 | Admitting: Medical

## 2019-05-30 ENCOUNTER — Encounter: Payer: Self-pay | Admitting: Family Medicine

## 2019-05-30 ENCOUNTER — Other Ambulatory Visit: Payer: Self-pay

## 2019-05-30 VITALS — BP 128/81 | HR 85 | Temp 96.3°F | Resp 18 | Wt 231.2 lb

## 2019-05-30 DIAGNOSIS — M79651 Pain in right thigh: Secondary | ICD-10-CM

## 2019-05-30 NOTE — Patient Instructions (Addendum)
For your right lower extremity/medial to proximal thigh area pain, I do think is best to go ahead and get right lower extremity ultrasound.  They can do either now or later around 7.  Please go down to confirm time with x-ray department.  I will call you or MyChart you later with the results.  If DVT were to be seen then would need to prescribe you medication or find samplel tabs to give until you fill prescription.  Will also follow radiologist recommendations regarding further imaging if they indicate that might be needed.  If study is negative then would recommend warm compresses twice a day over the superficial possible bruise vs  dilated  superficial vein.  Also would recommend continuing moderate dose ibuprofen 400 mg every 8 hours.  After reviewing x-ray result might give additional prescription medication but will wait till I reviewed the report.  Follow-up date to be determined as well after reviewing report .

## 2019-05-30 NOTE — Progress Notes (Signed)
   Subjective:    Patient ID: Michael Howe, male    DOB: June 10, 1970, 48 y.o.   MRN: [id number]  HPI   Pt in for some pain in his rt groin area. He was seated the other day and when he got up he had severe pain. He states this occurred on Saturday. He states pain was severe. That day pain subsided. Now pain is much less but still present.  Pt has history of nerve pain in sciatica area. But not reporting pain radiating from sciatic area to thigh. No bulge in groin. No report of testicle pain.  Faint discolored area medial upper thigh which he notice recently. Pain from this area down to distal medial thigh.   Currently he has pain level 3-4 pain.    Review of Systems  Constitutional: Negative for chills, fatigue and fever.  Respiratory: Negative for cough, chest tightness, shortness of breath and wheezing.   Cardiovascular: Negative for chest pain and palpitations.  Gastrointestinal: Negative for abdominal pain.  Musculoskeletal: Negative for back pain.       Thigh pain. See hpi.  Skin: Negative for rash.  Neurological: Negative for dizziness and light-headedness.  Hematological: Negative for adenopathy. Does not bruise/bleed easily.  Psychiatric/Behavioral: Negative for behavioral problems, confusion and sleep disturbance. The patient is not nervous/anxious.        Objective:   Physical Exam   General- No acute distress. Pleasant patient. Neck- Full range of motion, no jvd Lungs- Clear, even and unlabored. Heart- regular rate and rhythm. Neurologic- CNII- XII grossly intact.  Rt lower ext- no obvious swelling to thigh. Medial upper thigh faint discolored/possible light bruise. No obvious dilated superficial vein. Tender to palpation medial distal thigh to proximal upper thigh.      Assessment & Plan:  For your right lower extremity/medial to proximal thigh area pain, I do think is best to go ahead and get right lower extremity ultrasound.  They can do either now or later  around 7.  Please go down to confirm time with x-ray department.  I will call you or MyChart you later with the results.  If DVT were to be seen then would need to prescribe you medication or find samplel tabs to give until you fill prescription.  Will also follow radiologist recommendations regarding further imaging if they indicate that might be needed.  If study is negative then would recommend warm compresses twice a day over the superficial possible bruise vs  dilated  superficial vein.  Also would recommend continuing moderate dose ibuprofen 400 mg every 8 hours.  After reviewing x-ray result might give additional prescription medication but will wait till I reviewed the report.  Follow-up date to be determined as well after reviewing report  25 minute spent with pt. 50% of time spent counseling pt and coordinating scheduling [place].

## 2019-06-07 ENCOUNTER — Ambulatory Visit: Payer: 59 | Admitting: Medical

## 2019-06-07 ENCOUNTER — Encounter: Payer: Self-pay | Admitting: Medical

## 2019-06-07 ENCOUNTER — Other Ambulatory Visit: Payer: Self-pay

## 2019-06-07 VITALS — BP 110/60 | HR 102 | Temp 97.0°F | Resp 18 | Ht 70.0 in | Wt 228.0 lb

## 2019-06-07 DIAGNOSIS — L989 Disorder of the skin and subcutaneous tissue, unspecified: Secondary | ICD-10-CM | POA: Diagnosis not present

## 2019-06-07 MED ORDER — DOXYCYCLINE HYCLATE 100 MG PO TABS: 100.0000 mg | ORAL_TABLET | Freq: Two times a day (BID) | ORAL | 0 refills | Status: DC

## 2019-06-07 NOTE — Progress Notes (Signed)
Subjective:    Patient ID: Michael Howe, male    DOB: March 09, 1971, 48 y.o.   MRN: [id number]  HPI    Pt in for evaluation.  He has some soreness to nose and scab present for 4-5  months. He states area not healing. He does not remember how area came up. Pt states no severe burns to skin that he can remember.  Pt grandmother had skin cancer on her nose.    Review of Systems  Constitutional: Negative for chills, fatigue and fever.  HENT: Negative for congestion, ear discharge and facial swelling.   Respiratory: Negative for cough, chest tightness, shortness of breath and wheezing.   Cardiovascular: Negative for chest pain and palpitations.  Gastrointestinal: Negative for abdominal pain.  Musculoskeletal: Negative for back pain and neck pain.  Skin: Negative for rash.       Bridge of nose redness/faint discolored.  Neurological: Negative for dizziness, syncope, weakness, numbness and headaches.  Hematological: Negative for adenopathy. Does not bruise/bleed easily.  Psychiatric/Behavioral: Negative for behavioral problems, confusion and sleep disturbance. The patient is not nervous/anxious.      Past Medical History:  Diagnosis Date  . Allergic state 02/01/2012  . Allergy   . Cholelithiasis 03/10/2015  . Dyslipidemia 07/26/2013  . Gout   . Obesity 06/30/2016  . Preventative health care 07/14/2012  . Renal lithiasis 11/10/2015  . Umbilical hernia [id number]     Social History   Socioeconomic History  . Marital status: Married    Spouse name: Not on file  . Number of children: Not on file  . Years of education: Not on file  . Highest education level: Not on file  Occupational History  . Not on file  Tobacco Use  . Smoking status: Never Smoker  . Smokeless tobacco: Never Used  Substance and Sexual Activity  . Alcohol use: No  . Drug use: No  . 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 Not  on file  Social History Narrative  . Not on file   Social Determinants of Health   Financial Resource Strain:   . Difficulty of Paying Living Expenses: Not on file  Food Insecurity:   . Worried About Charity fundraiser in the Last Year: Not on file  . Ran Out of Food in the Last Year: Not on file  Transportation Needs:   . Lack of Transportation (Medical): Not on file  . Lack of Transportation (Non-Medical): Not on file  Physical Activity:   . Days of Exercise per Week: Not on file  . Minutes of Exercise per Session: Not on file  Stress:   . Feeling of Stress : Not on file  Social Connections:   . Frequency of Communication with Friends and Family: Not on file  . Frequency of Social Gatherings with Friends and Family: Not on file  . Attends Religious Services: Not on file  . Active Member of Clubs or Organizations: Not on file  . Attends Archivist Meetings: Not on file  . Marital Status: Not on file  Intimate Partner Violence:   . Fear of Current or Ex-Partner: Not on file  . Emotionally Abused: Not on file  . Physically Abused: Not on file  . Sexually Abused: Not on file    Past Surgical History:  Procedure Laterality Date  . [place]    Family History  Problem Relation Age of Onset  . Gout Maternal Grandmother   . Heart disease Maternal Grandmother   . Cancer Maternal Grandmother   . Hyperlipidemia Mother   . Diabetes Mother   . Bowel Disease Mother        IBS  . Heart disease Sister   . Autism Son   . Stroke Paternal Grandmother   . Alcohol abuse Father     No Known Allergies  Current Outpatient Medications on File Prior to Visit  Medication Sig Dispense Refill  . allopurinol (ZYLOPRIM) 300 MG tablet Take 1 tablet (300 mg total) by mouth daily. 90 tablet 3  . atorvastatin (LIPITOR) 10 MG tablet Take 1 tablet (10 mg total) by mouth daily. 90 tablet 5  . fexofenadine (ALLEGRA) 180 MG tablet Take 1 tablet (180 mg  total) by mouth daily as needed for allergies or rhinitis.    . fluticasone (FLONASE) 50 MCG/ACT nasal spray Place 2 sprays into both nostrils daily as needed for allergies or rhinitis. 16 g 2  . indomethacin (INDOCIN) 50 MG capsule Take 1 capsule (50 mg total) by mouth 2 (two) times daily as needed for moderate pain. 60 capsule 1  . KRILL OIL PO Take by mouth.    . NON FORMULARY Life way Kefir- drink 8 oz daily    . sildenafil (REVATIO) 20 MG tablet TAKE ONE TABLET BY MOUTH THREE TIMES A DAY 35 tablet 4   No current facility-administered medications on file prior to visit.    BP 110/60 (BP Location: Left Arm, Patient Position: Sitting, Cuff Size: Normal)   Pulse (!) 102   Temp (!) 97 F (36.1 C) (Temporal)   Resp 18   Ht 5\' 10"  (1.778 m)   Wt 228 lb (103.4 kg)   SpO2 96%   BMI 32.71 kg/m       Objective:   Physical Exam  General- No acute distress. Pleasant patient. Neck- Full range of motion, no jvd Lungs- Clear, even and unlabored. Heart- regular rate and rhythm. Neurologic- CNII- XII grossly intact.  Derm- at bridge of nose small discolored area that is mild tender to palpation.  Rt upper chest area- small dark moles. Some follicles inflamed.        Assessment & Plan:  For skin lesion on nose and follicles inflamed rx'd doxycycline antibiotic. Rx advisement given.   Refer to dermatologist for evaluation of nose area and to do skin cancer screening.  Follow up 2 weeks or as needed  If dermatologist appointment delayed past 3 weeks let me know.

## 2019-06-07 NOTE — Patient Instructions (Signed)
For skin lesion on nose and follicles inflamed rx'd doxycycline antibiotic. Rx advisement given.   Refer to dermatologist for evaluation of nose area and to do skin cancer screening.  Follow up 2 weeks or as needed  If dermatologist appointment delayed past 3 weeks let me know.

## 2019-07-10 ENCOUNTER — Emergency Department (HOSPITAL_BASED_OUTPATIENT_CLINIC_OR_DEPARTMENT_OTHER)
Admission: EM | Admit: 2019-07-10 | Discharge: 2019-07-10 | Disposition: A | Discharge status: Home / Self Care | Payer: BC Managed Care – PPO | Attending: Emergency Medicine | Admitting: Emergency Medicine

## 2019-07-10 ENCOUNTER — Encounter: Payer: Self-pay | Admitting: Medical

## 2019-07-10 ENCOUNTER — Other Ambulatory Visit: Payer: Self-pay

## 2019-07-10 ENCOUNTER — Encounter (HOSPITAL_BASED_OUTPATIENT_CLINIC_OR_DEPARTMENT_OTHER): Payer: Self-pay | Admitting: *Deleted

## 2019-07-10 ENCOUNTER — Encounter: Payer: Self-pay | Admitting: Internal Medicine

## 2019-07-10 DIAGNOSIS — S39012A Strain of muscle, fascia and tendon of lower back, initial encounter: Secondary | ICD-10-CM | POA: Diagnosis not present

## 2019-07-10 DIAGNOSIS — Z79899 Other long term (current) drug therapy: Secondary | ICD-10-CM | POA: Insufficient documentation

## 2019-07-10 DIAGNOSIS — S3992XA Unspecified injury of lower back, initial encounter: Secondary | ICD-10-CM | POA: Diagnosis not present

## 2019-07-10 DIAGNOSIS — Y999 Unspecified external cause status: Secondary | ICD-10-CM | POA: Diagnosis not present

## 2019-07-10 DIAGNOSIS — Y9289 Other specified places as the place of occurrence of the external cause: Secondary | ICD-10-CM | POA: Diagnosis not present

## 2019-07-10 DIAGNOSIS — X500XXA Overexertion from strenuous movement or load, initial encounter: Secondary | ICD-10-CM | POA: Insufficient documentation

## 2019-07-10 DIAGNOSIS — Y9389 Activity, other specified: Secondary | ICD-10-CM | POA: Diagnosis not present

## 2019-07-10 MED ORDER — HYDROCODONE-ACETAMINOPHEN 5-325 MG PO TABS: 1.0000 | ORAL_TABLET | Freq: Four times a day (QID) | ORAL | 0 refills | Status: DC | PRN

## 2019-07-10 MED ORDER — KETOROLAC TROMETHAMINE 60 MG/2ML IM SOLN
60.0000 mg | Freq: Once | INTRAMUSCULAR | Status: AC
  Administered 2019-07-10: 60 mg via INTRAMUSCULAR
  Filled 2019-07-10: qty 2

## 2019-07-10 MED ORDER — PREDNISONE 10 MG PO TABS: 20.0000 mg | ORAL_TABLET | Freq: Two times a day (BID) | ORAL | 0 refills | Status: DC

## 2019-07-10 MED ORDER — PREDNISONE 20 MG PO TABS
40.0000 mg | ORAL_TABLET | Freq: Once | ORAL | Status: AC
  Administered 2019-07-10: 40 mg via ORAL
  Filled 2019-07-10: qty 2

## 2019-07-10 MED ORDER — HYDROCODONE-ACETAMINOPHEN 5-325 MG PO TABS
2.0000 | ORAL_TABLET | Freq: Once | ORAL | Status: AC
  Administered 2019-07-10: 2 via ORAL
  Filled 2019-07-10: qty 2

## 2019-07-10 NOTE — ED Triage Notes (Signed)
Pt c/o lower right back pain which radiates down  right leg x 1 day

## 2019-07-10 NOTE — Discharge Instructions (Addendum)
Prednisone as prescribed.  Hydrocodone as prescribed as needed for pain not relieved with prednisone.  Follow-up with your primary doctor if symptoms or not improving in the next 1 to 2 weeks, and return to the ER if symptoms significantly worsen or change.

## 2019-07-10 NOTE — ED Provider Notes (Signed)
[facility] EMERGENCY DEPARTMENT Provider Note   CSN: [id number] Arrival date & time: 07/10/19  1831     History Chief Complaint  Patient presents with  . Back Pain    Michael Howe is a 49 y.o. male.  Patient is a 49 year old male with history of hyperlipidemia, sacroiliac dysfunction.  He presents today for evaluation of low back pain.  Patient states he was bending over to pick his son up off the floor when he felt a pull in his back followed by pain.  He describes pain to the right lower lumbar region with no radiation into his leg.  He denies any bowel or bladder complaints.  He denies any weakness or numbness.  The history is provided by the patient.  Back Pain Location:  Lumbar spine Quality:  Stabbing Radiates to:  Does not radiate Pain severity:  Severe Onset quality:  Sudden Duration:  1 day Timing:  Constant Progression:  Worsening Chronicity:  New Relieved by:  Nothing Worsened by:  Palpation, ambulation and movement      Past Medical History:  Diagnosis Date  . Allergic state 02/01/2012  . Allergy   . Cholelithiasis 03/10/2015  . Dyslipidemia 07/26/2013  . Gout   . Obesity 06/30/2016  . Preventative health care 07/14/2012  . Renal lithiasis 11/10/2015  . Umbilical hernia [id number]    Patient Active Problem List   Diagnosis Date Noted  . Educated about COVID-19 virus infection 01/22/2019  . Right hip pain 01/16/2018  . Epigastric pain 07/05/2016  . Obesity 06/30/2016  . Renal lithiasis 11/10/2015  . 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 Cholelithiasis 03/10/2015  . Umbilical hernia [id number]  . RUQ pain 03/01/2015  . Dyslipidemia 07/26/2013  . Preventative health care 07/14/2012  . Gout 02/01/2012  . Allergic state 02/01/2012  . ED (erectile dysfunction) 02/01/2012    Past Surgical History:  Procedure Laterality Date  . TONSILLECTOMY AND ADENOIDECTOMY  1975       Family History  Problem Relation Age of Onset  . Gout Maternal  Grandmother   . Heart disease Maternal Grandmother   . Cancer Maternal Grandmother   . Hyperlipidemia Mother   . Diabetes Mother   . Bowel Disease Mother        IBS  . Heart disease Sister   . Autism Son   . Stroke Paternal Grandmother   . Alcohol abuse Father     Social History   Tobacco Use  . Smoking status: Never Smoker  . Smokeless tobacco: Never Used  Substance Use Topics  . Alcohol use: No  . Drug use: No    Home Medications Prior to Admission medications   Medication Sig Start Date End Date Taking? Authorizing Provider  allopurinol (ZYLOPRIM) 300 MG tablet Take 1 tablet (300 mg total) by mouth daily. 07/22/18   Mosie Lukes, MD  atorvastatin (LIPITOR) 10 MG tablet Take 1 tablet (10 mg total) by mouth daily. 07/22/18   Mosie Lukes, MD  doxycycline (VIBRA-TABS) 100 MG tablet Take 1 tablet (100 mg total) by mouth 2 (two) times daily. 06/07/19   Saguier, Percell Miller, PA-C  fexofenadine (ALLEGRA) 180 MG tablet Take 1 tablet (180 mg total) by mouth daily as needed for allergies or rhinitis. 07/24/13   Mosie Lukes, MD  fluticasone (FLONASE) 50 MCG/ACT nasal spray Place 2 sprays into both nostrils daily as needed for allergies or rhinitis. 07/26/13   Mosie Lukes, MD  indomethacin (INDOCIN) 50 MG capsule Take 1 capsule (50 mg  total) by mouth 2 (two) times daily as needed for moderate pain. 07/22/18   Mosie Lukes, MD  KRILL OIL PO Take by mouth.    [provider]  NON FORMULARY Life way Kefir- drink 8 oz daily    [provider]  sildenafil (REVATIO) 20 MG tablet TAKE ONE TABLET BY MOUTH THREE TIMES A DAY 06/24/18   Mosie Lukes, MD    Allergies    Patient has no known allergies.  Review of Systems   Review of Systems  Musculoskeletal: Positive for back pain.  All other systems reviewed and are negative.   Physical Exam Updated Vital Signs BP 133/82 (BP Location: Left Arm)   Pulse 84   Temp 98.7 F (37.1 C) (Oral)   Resp 20   Ht 5\' 10"   (1.778 m)   Wt 99.8 kg   SpO2 99%   BMI 31.57 kg/m   Physical Exam Vitals and nursing note reviewed.  Constitutional:      General: He is not in acute distress.    Appearance: Normal appearance. He is not ill-appearing or toxic-appearing.  HENT:     Head: Normocephalic.  Pulmonary:     Effort: Pulmonary effort is normal.  Musculoskeletal:     Comments: There is tenderness to palpation in the soft tissues of the right lower lumbar region.  There is no bony tenderness or step-off.  Skin:    General: Skin is warm and dry.  Neurological:     Mental Status: He is alert and oriented to person, place, and time.     Comments: Strength is 5 out of 5 in both lower extremities.  DTRs are 2+ and symmetrical in the patellar and Achilles tendons bilaterally.  He is able to ambulate on his heels and toes, but this causes discomfort.     ED Results / Procedures / Treatments   Labs (all labs ordered are listed, but only abnormal results are displayed) Labs Reviewed - No data to display  EKG None  Radiology No results found.  Procedures Procedures (including critical care time)  Medications Ordered in ED Medications  ketorolac (TORADOL) injection 60 mg (has no administration in time range)  HYDROcodone-acetaminophen (NORCO/VICODIN) 5-325 MG per tablet 2 tablet (has no administration in time range)  predniSONE (DELTASONE) tablet 40 mg (has no administration in time range)    ED Course  I have reviewed the triage vital signs and the nursing notes.  Pertinent labs & imaging results that were available during my care of the patient were reviewed by me and considered in my medical decision making (see chart for details).    MDM Rules/Calculators/A&P  Patient presents with acute onset of low back pain that sounds musculoskeletal.  There are no red flags that would suggest an emergent cause.  He has no bowel or bladder issues and his strength and reflexes are normal.  I feel as though  treatment with a round of prednisone, pain medicine, and follow-up as needed is appropriate at this time.  He is to see his doctor if not improving in the next 1 to 2 weeks.  Final Clinical Impression(s) / ED Diagnoses Final diagnoses:  None    Rx / DC Orders ED Discharge Orders    None       Veryl Speak, MD 07/10/19 1943

## 2019-07-11 ENCOUNTER — Ambulatory Visit: Payer: 59 | Admitting: Internal Medicine

## 2019-09-11 DIAGNOSIS — L570 Actinic keratosis: Secondary | ICD-10-CM | POA: Diagnosis not present

## 2019-09-11 DIAGNOSIS — D239 Other benign neoplasm of skin, unspecified: Secondary | ICD-10-CM | POA: Diagnosis not present

## 2020-01-11 ENCOUNTER — Encounter: Payer: Self-pay | Admitting: Family Medicine

## 2020-01-12 MED ORDER — ALLOPURINOL 300 MG PO TABS: 300.0000 mg | ORAL_TABLET | Freq: Every day | ORAL | 0 refills | Status: DC

## 2020-01-14 DIAGNOSIS — H66003 Acute suppurative otitis media without spontaneous rupture of ear drum, bilateral: Secondary | ICD-10-CM | POA: Diagnosis not present

## 2020-03-11 ENCOUNTER — Encounter: Payer: Self-pay | Admitting: Family Medicine

## 2020-03-11 ENCOUNTER — Other Ambulatory Visit: Payer: Self-pay

## 2020-03-11 ENCOUNTER — Other Ambulatory Visit: Payer: Self-pay | Admitting: Family Medicine

## 2020-03-11 DIAGNOSIS — N529 Male erectile dysfunction, unspecified: Secondary | ICD-10-CM

## 2020-03-11 MED ORDER — TADALAFIL 10 MG PO TABS: 10.0000 mg | ORAL_TABLET | Freq: Every day | ORAL | 1 refills | Status: DC | PRN

## 2020-03-11 MED ORDER — SILDENAFIL CITRATE 20 MG PO TABS: 20.0000 mg | ORAL_TABLET | Freq: Three times a day (TID) | ORAL | 4 refills | Status: DC

## 2020-03-11 NOTE — Telephone Encounter (Signed)
Patient would like to know if he can switch to generic Cialis?

## 2020-03-11 NOTE — Telephone Encounter (Signed)
LMOM to return call.

## 2020-03-12 ENCOUNTER — Other Ambulatory Visit: Payer: Self-pay | Admitting: *Deleted

## 2020-03-12 MED ORDER — TADALAFIL 10 MG PO TABS: 10.0000 mg | ORAL_TABLET | Freq: Every day | ORAL | 1 refills | Status: DC | PRN

## 2020-04-04 ENCOUNTER — Other Ambulatory Visit: Payer: Self-pay | Admitting: Family Medicine

## 2020-05-07 ENCOUNTER — Other Ambulatory Visit: Payer: Self-pay | Admitting: Family Medicine

## 2020-05-15 ENCOUNTER — Telehealth: Payer: Self-pay | Admitting: Family Medicine

## 2020-05-15 ENCOUNTER — Other Ambulatory Visit: Payer: Self-pay | Admitting: Family Medicine

## 2020-05-15 MED ORDER — AZITHROMYCIN 250 MG PO TABS: ORAL_TABLET | ORAL | 0 refills | Status: DC

## 2020-05-15 MED ORDER — FAMOTIDINE 40 MG PO TABS: 40.0000 mg | ORAL_TABLET | Freq: Two times a day (BID) | ORAL | 1 refills | Status: DC

## 2020-05-15 MED ORDER — ALBUTEROL SULFATE HFA 108 (90 BASE) MCG/ACT IN AERS: 2.0000 | INHALATION_SPRAY | Freq: Four times a day (QID) | RESPIRATORY_TRACT | 1 refills | Status: DC | PRN

## 2020-05-15 NOTE — Telephone Encounter (Signed)
Patient called has tested + for covid last night 12.06.2021 at [facility]   Patient calling to see if Charlett Blake would recommend antibodies   Caller: Roderic Palau  Call: [phone]

## 2020-05-15 NOTE — Telephone Encounter (Signed)
Please refer him to the [facility] so they can put him through the evaluation process. He is a male over 36 so he does have some risk. There is a shortage so I am not sure if he will qualify. I will send him in Azithromycin, albuterol inhaler to use prn if he develops bad cough, wheezing or sob and I will send in some prescription strength Famotidine as well. All of these may help some. Please make sure he knows to get a pulse oximeter (can get at pharmacy if he does not have one) and he should monitor his oxygen levels. If he remains in 90s we can manage at home if he drops to 80s he needs to go get looked at. Also offer him a virtual visit Friday morning if he wants to discuss and find out what symptoms he has. Finally Take Multivitamin with minerals, selenium Vitamin D 1000-2000 IU daily Probiotic with lactobacillus and bifidophilus Asprin EC 81 mg daily Fish or krill oil caps daily Melatonin 2-5 mg at bedtime All of this may help some.

## 2020-05-17 MED ORDER — FAMOTIDINE 40 MG PO TABS: 40.0000 mg | ORAL_TABLET | Freq: Two times a day (BID) | ORAL | 1 refills | Status: AC

## 2020-05-17 MED ORDER — ALBUTEROL SULFATE HFA 108 (90 BASE) MCG/ACT IN AERS: 2.0000 | INHALATION_SPRAY | Freq: Four times a day (QID) | RESPIRATORY_TRACT | 1 refills | Status: AC | PRN

## 2020-05-17 MED ORDER — AZITHROMYCIN 250 MG PO TABS: ORAL_TABLET | ORAL | 0 refills | Status: DC

## 2020-05-17 NOTE — Telephone Encounter (Signed)
Patient notified that rxs been sent in.  He need them sent to [facility] instead cause his insurance does not work at [facility].  [employer] sent to pt about recommendations.  Email sent to [facility] to see if he qualifies for infusion.

## 2020-05-17 NOTE — Addendum Note (Signed)
Addended by: Kem Boroughs D on: 05/17/2020 10:10 AM   Modules accepted: Orders

## 2020-05-18 ENCOUNTER — Encounter: Payer: Self-pay | Admitting: Oncology

## 2020-05-18 ENCOUNTER — Telehealth: Payer: Self-pay | Admitting: Oncology

## 2020-05-18 NOTE — Telephone Encounter (Signed)
Re: [facility] Infusion  Called to Discuss with patient about Covid symptoms and the use of regeneron, a monoclonal antibody infusion for those with mild to moderate Covid symptoms and at a high risk of hospitalization.     Pt is qualified for this infusion at the [facility] center due to co-morbid conditions and/or a member of an at-risk group.    Past Medical History:  Diagnosis Date  . Allergic state 02/01/2012  . Allergy   . Cholelithiasis 03/10/2015  . Dyslipidemia 07/26/2013  . Gout   . Obesity 06/30/2016  . Preventative health care 07/14/2012  . Renal lithiasis 11/10/2015  . Umbilical hernia 4/88/3014    Specific risk condition-obesity   Unable to reach pt. Left VM and MCM.  Rulon Abide, AGNP-C [phone] ([place])

## 2020-06-20 ENCOUNTER — Encounter: Payer: Self-pay | Admitting: Family Medicine

## 2020-06-20 ENCOUNTER — Other Ambulatory Visit: Payer: Self-pay | Admitting: Family Medicine

## 2020-06-20 MED ORDER — COLCHICINE 0.6 MG PO TABS: ORAL_TABLET | ORAL | 1 refills | Status: DC

## 2020-10-30 ENCOUNTER — Encounter: Payer: Self-pay | Admitting: Family Medicine

## 2020-10-30 DIAGNOSIS — Z1211 Encounter for screening for malignant neoplasm of colon: Secondary | ICD-10-CM

## 2020-11-29 DIAGNOSIS — R059 Cough, unspecified: Secondary | ICD-10-CM | POA: Diagnosis not present

## 2020-11-29 DIAGNOSIS — J329 Chronic sinusitis, unspecified: Secondary | ICD-10-CM | POA: Diagnosis not present

## 2021-01-14 ENCOUNTER — Encounter: Payer: Self-pay | Admitting: Family Medicine

## 2021-01-15 MED ORDER — COLCHICINE 0.6 MG PO TABS: ORAL_TABLET | ORAL | 0 refills | Status: DC

## 2021-01-15 MED ORDER — TADALAFIL 10 MG PO TABS: 10.0000 mg | ORAL_TABLET | Freq: Every day | ORAL | 0 refills | Status: DC | PRN

## 2021-01-20 ENCOUNTER — Encounter: Payer: Self-pay | Admitting: Family Medicine

## 2021-01-20 ENCOUNTER — Ambulatory Visit (INDEPENDENT_AMBULATORY_CARE_PROVIDER_SITE_OTHER): Payer: BC Managed Care – PPO | Admitting: Family Medicine

## 2021-01-20 ENCOUNTER — Other Ambulatory Visit: Payer: Self-pay

## 2021-01-20 VITALS — BP 112/70 | HR 95 | Temp 98.1°F | Ht 70.0 in | Wt 234.5 lb

## 2021-01-20 DIAGNOSIS — E785 Hyperlipidemia, unspecified: Secondary | ICD-10-CM | POA: Diagnosis not present

## 2021-01-20 DIAGNOSIS — M1A9XX0 Chronic gout, unspecified, without tophus (tophi): Secondary | ICD-10-CM | POA: Diagnosis not present

## 2021-01-20 MED ORDER — COLCHICINE 0.6 MG PO TABS: ORAL_TABLET | ORAL | 1 refills | Status: DC

## 2021-01-20 MED ORDER — ATORVASTATIN CALCIUM 10 MG PO TABS: 10.0000 mg | ORAL_TABLET | Freq: Every day | ORAL | 2 refills | Status: DC

## 2021-01-20 NOTE — Patient Instructions (Addendum)
Give [place] 2-3 business days to get the results of your labs back.   Foods to AVOID: Red meat, organ meat (liver), lunch meat, seafood (mussels, scallops, anchovies, etc) Alcohol Sugary foods/beverages (diet soft drinks have no link to flares)  Foods to migrate to: Dairy Vegetables Cherries have limited data to suggest they help lower uric acid levels (and prevent flares) Vit C (500 mg daily) may have a modest effect with preventing flares Poultry If you are going to eat red meat, beef and pork may give you less problems than lamb.  Keep the diet clean and stay active.  Let us know if you need anything.

## 2021-01-20 NOTE — Progress Notes (Signed)
Chief Complaint  Patient presents with   Follow-up    Needs medication refills    Michael Howe is a 50 y.o. male here for gout.  Currently being treated with allopurinol 300 mg/d. The joint(s) affected include: L great toe Most recent uric acid level is: 6.1 Takes intermittently, 3-4x/week. Side effects of medications: None Is usually avoiding seafood, sweet/sugary beverages, alcohol, and red meats.  Dyslipidemia Patient presents for dyslipidemia follow up. Currently being treated with Lipitor 10 mg/d and compliance with treatment thus far has been OK; takes 3-4x/mo. He denies myalgias. He is adhering to a healthy diet. Exercise: running, lifting The patient is not known to have coexisting coronary artery disease. No Cp or SOB.   Past Medical History:  Diagnosis Date   Allergic state 02/01/2012   Allergy    Cholelithiasis 03/10/2015   Dyslipidemia 07/26/2013   Gout    Obesity 06/30/2016   Preventative health care 07/14/2012   Renal lithiasis [id number]   Umbilical hernia [id number]    BP 112/70   Pulse 95   Temp 98.1 F (36.7 C) (Oral)   Ht '5\' 10"'$  (1.778 m)   Wt 234 lb 8 oz (106.4 kg)   SpO2 96%   BMI 33.65 kg/m  Gen: Awake, alert, appears stated age Neck: No masses or asymmetry Heart: RRR, no bruits, no LE edema Lungs: CTAB, no accessory muscle use Skin: No erythema or warmth Psych: Age appropriate judgment and insight, nml mood and affect  Chronic gout without tophus, unspecified cause, unspecified site - Plan: colchicine 0.6 MG tablet, Uric acid  Dyslipidemia - Plan: atorvastatin (LIPITOR) 10 MG tablet, Lipid panel, Comprehensive metabolic panel  Cont allopurinol 300 mg/d. Encouraged compliance. Colchicine prn. Ck urate today. Reminded to avoid foods like alcohol, sweet beverages, red meat, lunch meat, sea food. Start taking Lipitor 10 mg/d instead of 3-4x/mo. He will come back in 6 weeks for labs.  F/u in 6 mo. The patient voiced understanding and agreement  to the plan.  [place], DO 01/20/21 3:46 PM

## 2021-03-03 ENCOUNTER — Other Ambulatory Visit (INDEPENDENT_AMBULATORY_CARE_PROVIDER_SITE_OTHER): Payer: BC Managed Care – PPO

## 2021-03-03 ENCOUNTER — Other Ambulatory Visit: Payer: Self-pay

## 2021-03-03 DIAGNOSIS — E785 Hyperlipidemia, unspecified: Secondary | ICD-10-CM

## 2021-03-03 DIAGNOSIS — M1A9XX0 Chronic gout, unspecified, without tophus (tophi): Secondary | ICD-10-CM

## 2021-03-03 LAB — COMPREHENSIVE METABOLIC PANEL
ALT: 34 U/L (ref 0–53)
AST: 18 U/L (ref 0–37)
Albumin: 4.1 g/dL (ref 3.5–5.2)
Alkaline Phosphatase: 35 U/L — ABNORMAL LOW (ref 39–117)
BUN: 13 mg/dL (ref 6–23)
CO2: 29 mEq/L (ref 19–32)
Calcium: 9.2 mg/dL (ref 8.4–10.5)
Chloride: 104 mEq/L (ref 96–112)
Creatinine, Ser: 1.08 mg/dL (ref 0.40–1.50)
GFR: 78.92 mL/min (ref >60.00)
Glucose, Bld: 97 mg/dL (ref 70–99)
Potassium: 4.3 mEq/L (ref 3.5–5.1)
Sodium: 139 mEq/L (ref 135–145)
Total Bilirubin: 0.6 mg/dL (ref 0.2–1.2)
Total Protein: 6.4 g/dL (ref 6.0–8.3)

## 2021-03-03 LAB — LIPID PANEL
Cholesterol: 107 mg/dL (ref 0–200)
HDL: 33.4 mg/dL — ABNORMAL LOW (ref >39.00)
LDL Cholesterol: 51 mg/dL (ref 0–99)
NonHDL: 73.85
Total CHOL/HDL Ratio: 3
Triglycerides: 113 mg/dL (ref 0.0–149.0)
VLDL: 22.6 mg/dL (ref 0.0–40.0)

## 2021-03-03 LAB — URIC ACID: Uric Acid, Serum: 5.5 mg/dL (ref 4.0–7.8)

## 2021-04-03 ENCOUNTER — Other Ambulatory Visit: Payer: Self-pay | Admitting: Family Medicine

## 2021-08-04 ENCOUNTER — Encounter: Payer: BC Managed Care – PPO | Admitting: Family Medicine

## 2021-08-25 ENCOUNTER — Encounter: Payer: BC Managed Care – PPO | Admitting: Family Medicine

## 2021-08-31 ENCOUNTER — Encounter: Payer: Self-pay | Admitting: Family Medicine

## 2021-09-01 NOTE — Progress Notes (Signed)
? ?Subjective:  ? ? Patient ID: Michael Howe, male    DOB: [birth year], 51 y.o.   MRN: [id number] ? ?Chief Complaint  ?Patient presents with  ? Annual Exam  ? ? ?HPI ?Patient is in today for his annual physical exam and follow up on chronic medical concerns. No recent febrile illness or hospitalizations. He tries to stay active and maintain a heart healthy diet but does work long hours so struggles at times. His children are doing well. He has not set up his screening colonoscopy yet due to his schedule but he agrees to proceed now. Denies CP/palp/SOB/HA/congestion/fevers/GI or GU c/o. Taking meds as prescribed  ? ?Past Medical History:  ?Diagnosis Date  ? Allergic state 02/01/2012  ? Allergy   ? Cholelithiasis 03/10/2015  ? Dyslipidemia 07/26/2013  ? Gout   ? Obesity 06/30/2016  ? Preventative health care 07/14/2012  ? Renal lithiasis 11/10/2015  ? Umbilical hernia 2/68/3419  ? ? ?Past Surgical History:  ?Procedure Laterality Date  ? TONSILLECTOMY AND ADENOIDECTOMY  1975  ? ? ?Family History  ?Problem Relation Age of Onset  ? Hyperlipidemia Mother   ? Diabetes Mother   ? Bowel Disease Mother   ?     IBS  ? Alcohol abuse Father   ? Heart disease Sister   ? Autism Son   ? Gout Maternal Grandmother   ? Heart disease Maternal Grandmother   ? Cancer Maternal Grandmother   ? Stroke Paternal Grandmother   ? ? ?Social History  ? ?Socioeconomic History  ? Marital status: Married  ?  Spouse name: Not on file  ? Number of children: Not on file  ? Years of education: Not on file  ? Highest education level: Not on file  ?Occupational History  ? Not on file  ?Tobacco Use  ? Smoking status: Never  ? Smokeless tobacco: Never  ?Substance and Sexual Activity  ? Alcohol use: No  ? Drug use: No  ? Sexual activity: Yes  ?  Comment: lives with wife an kids, Freight forwarder in sales, no dietary restrictions  ?Other Topics Concern  ? Not on file  ?Social History Narrative  ? Not on file  ? ?Social Determinants of Health  ? ?Financial Resource Strain: Not  on file  ?Food Insecurity: Not on file  ?Transportation Needs: Not on file  ?Physical Activity: Not on file  ?Stress: Not on file  ?Social Connections: Not on file  ?Intimate Partner Violence: Not on file  ? ? ?Outpatient Medications Prior to Visit  ?Medication Sig Dispense Refill  ? albuterol (VENTOLIN HFA) 108 (90 Base) MCG/ACT inhaler Inhale 2 puffs into the lungs every 6 (six) hours as needed for wheezing or shortness of breath. 18 g 1  ? allopurinol (ZYLOPRIM) 300 MG tablet TAKE 1 TABLET BY MOUTH EVERY DAY 90 tablet 1  ? atorvastatin (LIPITOR) 10 MG tablet Take 1 tablet (10 mg total) by mouth daily. 90 tablet 2  ? colchicine 0.6 MG tablet 2 tabs po once then 1 tab po q 2 hours prn pain til pain gone, max of 6 tabs in 24 hours or intolerable diarrhea 30 tablet 1  ? fluticasone (FLONASE) 50 MCG/ACT nasal spray Place 2 sprays into both nostrils daily as needed for allergies or rhinitis. 16 g 2  ? levocetirizine (XYZAL) 5 MG tablet Take 5 mg by mouth every evening.    ? tadalafil (CIALIS) 10 MG tablet Take 1-2 tablets (10-20 mg total) by mouth daily as needed for erectile  dysfunction. 30 tablet 0  ? famotidine (PEPCID) 40 MG tablet Take 1 tablet (40 mg total) by mouth 2 (two) times daily. (Patient not taking: Reported on 01/20/2021) 60 tablet 1  ? ?No facility-administered medications prior to visit.  ? ? ?Allergies  ?Allergen Reactions  ? Pollen Extract   ? ? ?Review of Systems  ?Constitutional:  Negative for chills, fever and malaise/fatigue.  ?HENT:  Negative for congestion and hearing loss.   ?Eyes:  Negative for discharge.  ?Respiratory:  Negative for cough, sputum production and shortness of breath.   ?Cardiovascular:  Negative for chest pain, palpitations and leg swelling.  ?Gastrointestinal:  Negative for abdominal pain, blood in stool, constipation, diarrhea, heartburn, nausea and vomiting.  ?Genitourinary:  Negative for dysuria, frequency, hematuria and urgency.  ?Musculoskeletal:  Negative for back pain,  falls and myalgias.  ?Skin:  Negative for rash.  ?Neurological:  Negative for dizziness, sensory change, loss of consciousness, weakness and headaches.  ?Endo/Heme/Allergies:  Negative for environmental allergies. Does not bruise/bleed easily.  ?Psychiatric/Behavioral:  Negative for depression and suicidal ideas. The patient is not nervous/anxious and does not have insomnia.   ? ?   ?Objective:  ?  ?Physical Exam ?Constitutional:   ?   General: He is not in acute distress. ?   Appearance: Normal appearance. He is not ill-appearing or toxic-appearing.  ?HENT:  ?   Head: Normocephalic and atraumatic.  ?   Right Ear: External ear normal.  ?   Left Ear: External ear normal.  ?   Nose: Nose normal.  ?Eyes:  ?   General:     ?   Right eye: No discharge.     ?   Left eye: No discharge.  ?Cardiovascular:  ?   Rate and Rhythm: Normal rate.  ?   Pulses: Normal pulses.  ?Pulmonary:  ?   Effort: Pulmonary effort is normal.  ?Skin: ?   Findings: No rash.  ?Neurological:  ?   Mental Status: He is alert and oriented to person, place, and time.  ?Psychiatric:     ?   Behavior: Behavior normal.  ? ? ?BP 128/80 (BP Location: Left Arm, Patient Position: Sitting, Cuff Size: Normal)   Pulse 78   Ht '5\' 10"'$  (1.778 m)   Wt 240 lb 12.8 oz (109.2 kg)   SpO2 95%   BMI 34.55 kg/m?  ?Wt Readings from Last 3 Encounters:  ?09/02/21 240 lb 12.8 oz (109.2 kg)  ?01/20/21 234 lb 8 oz (106.4 kg)  ?07/10/19 220 lb (99.8 kg)  ? ? ?Diabetic Foot Exam - Simple   ?No data filed ?  ? ?Lab Results  ?Component Value Date  ? WBC 6.5 09/02/2021  ? HGB 15.2 09/02/2021  ? HCT 45.2 09/02/2021  ? PLT 267.0 09/02/2021  ? GLUCOSE 93 09/02/2021  ? CHOL 130 09/02/2021  ? TRIG 145.0 09/02/2021  ? HDL 38.80 (L) 09/02/2021  ? LDLDIRECT 105.0 06/30/2016  ? [place] 62 09/02/2021  ? ALT 48 09/02/2021  ? AST 27 09/02/2021  ? NA 138 09/02/2021  ? K 4.2 09/02/2021  ? CL 102 09/02/2021  ? CREATININE 1.00 09/02/2021  ? BUN 14 09/02/2021  ? CO2 26 09/02/2021  ? TSH 1.53  09/02/2021  ? PSA 0.50 09/02/2021  ? ? ?Lab Results  ?Component Value Date  ? TSH 1.53 09/02/2021  ? ?Lab Results  ?Component Value Date  ? WBC 6.5 09/02/2021  ? HGB 15.2 09/02/2021  ? HCT 45.2 09/02/2021  ? MCV 88.7  09/02/2021  ? PLT 267.0 09/02/2021  ? ?Lab Results  ?Component Value Date  ? NA 138 09/02/2021  ? K 4.2 09/02/2021  ? CO2 26 09/02/2021  ? GLUCOSE 93 09/02/2021  ? BUN 14 09/02/2021  ? CREATININE 1.00 09/02/2021  ? BILITOT 0.6 09/02/2021  ? ALKPHOS 40 09/02/2021  ? AST 27 09/02/2021  ? ALT 48 09/02/2021  ? PROT 6.7 09/02/2021  ? ALBUMIN 4.6 09/02/2021  ? CALCIUM 9.8 09/02/2021  ? GFR 86.25 09/02/2021  ? ?Lab Results  ?Component Value Date  ? CHOL 130 09/02/2021  ? ?Lab Results  ?Component Value Date  ? HDL 38.80 (L) 09/02/2021  ? ?Lab Results  ?Component Value Date  ? [place] 62 09/02/2021  ? ?Lab Results  ?Component Value Date  ? TRIG 145.0 09/02/2021  ? ?Lab Results  ?Component Value Date  ? CHOLHDL 3 09/02/2021  ? ?No results found for: HGBA1C ? ?   ?Assessment & Plan:  ? ?Problem List Items Addressed This Visit   ? ? Preventative health care - Primary (Chronic)  ?  Patient encouraged to maintain heart healthy diet, regular exercise, adequate sleep. Consider daily probiotics. Take medications as prescribed. Labs ordered and reviewed. Agrees to schedule screening colonoscopy.  ?  ?  ? Relevant Orders  ? Lipid panel (Completed)  ? Comprehensive metabolic panel (Completed)  ? CBC (Completed)  ? Dyslipidemia  ?  Encourage heart healthy diet such as MIND or DASH diet, increase exercise, avoid trans fats, simple carbohydrates and processed foods, consider a krill or fish or flaxseed oil cap daily. Tolerating Atorvastatin ?  ?  ? Relevant Orders  ? Lipid panel (Completed)  ? TSH (Completed)  ? Gout  ?  Hydrate and monitor ?  ?  ? Relevant Orders  ? CBC (Completed)  ? Uric acid (Completed)  ? Umbilical hernia  ?  Stable discussed with patient if worsens then he will let us know so we can consider referral to  general surgery for consultation ?  ?  ? Renal lithiasis  ? Relevant Orders  ? Comprehensive metabolic panel (Completed)  ? TSH (Completed)  ? Obesity  ?  Maintain heart healthy DASH or MIND diet, decrease po

## 2021-09-02 ENCOUNTER — Encounter: Payer: Self-pay | Admitting: Family Medicine

## 2021-09-02 ENCOUNTER — Ambulatory Visit (INDEPENDENT_AMBULATORY_CARE_PROVIDER_SITE_OTHER): Payer: BC Managed Care – PPO | Admitting: Family Medicine

## 2021-09-02 VITALS — BP 128/80 | HR 78 | Ht 70.0 in | Wt 240.8 lb

## 2021-09-02 DIAGNOSIS — N200 Calculus of kidney: Secondary | ICD-10-CM | POA: Diagnosis not present

## 2021-09-02 DIAGNOSIS — K429 Umbilical hernia without obstruction or gangrene: Secondary | ICD-10-CM

## 2021-09-02 DIAGNOSIS — R000 Tachycardia, unspecified: Secondary | ICD-10-CM | POA: Diagnosis not present

## 2021-09-02 DIAGNOSIS — E785 Hyperlipidemia, unspecified: Secondary | ICD-10-CM | POA: Diagnosis not present

## 2021-09-02 DIAGNOSIS — E6609 Other obesity due to excess calories: Secondary | ICD-10-CM

## 2021-09-02 DIAGNOSIS — R351 Nocturia: Secondary | ICD-10-CM | POA: Diagnosis not present

## 2021-09-02 DIAGNOSIS — M1A9XX0 Chronic gout, unspecified, without tophus (tophi): Secondary | ICD-10-CM

## 2021-09-02 DIAGNOSIS — Z1211 Encounter for screening for malignant neoplasm of colon: Secondary | ICD-10-CM | POA: Diagnosis not present

## 2021-09-02 DIAGNOSIS — Z0000 Encounter for general adult medical examination without abnormal findings: Secondary | ICD-10-CM

## 2021-09-02 NOTE — Assessment & Plan Note (Signed)
Patient encouraged to maintain heart healthy diet, regular exercise, adequate sleep. Consider daily probiotics. Take medications as prescribed. Labs ordered and reviewed. Agrees to schedule screening colonoscopy.  ?

## 2021-09-02 NOTE — Patient Instructions (Addendum)
?Shingrix is the new shingles shot, 2 shots over 2-6 months, confirm coverage with insurance and document, then can return here for shots with nurse appt or at pharmacy  ? ?Call to set up colonoscopy or call to set up cologuard ? ?Preventive Care 65-53 Years Old, Male ?Preventive care refers to lifestyle choices and visits with your health care provider that can promote health and wellness. Preventive care visits are also called wellness exams. ?What can I expect for my preventive care visit? ?Counseling ?During your preventive care visit, your health care provider may ask about your: ?Medical history, including: ?Past medical problems. ?Family medical history. ?Current health, including: ?Emotional well-being. ?Home life and relationship well-being. ?Sexual activity. ?Lifestyle, including: ?Alcohol, nicotine or tobacco, and drug use. ?Access to firearms. ?Diet, exercise, and sleep habits. ?Safety issues such as seatbelt and bike helmet use. ?Sunscreen use. ?Work and work Statistician. ?Physical exam ?Your health care provider will check your: ?Height and weight. These may be used to calculate your BMI (body mass index). BMI is a measurement that tells if you are at a healthy weight. ?Waist circumference. This measures the distance around your waistline. This measurement also tells if you are at a healthy weight and may help predict your risk of certain diseases, such as type 2 diabetes and high blood pressure. ?Heart rate and blood pressure. ?Body temperature. ?Skin for abnormal spots. ?What immunizations do I need? ?Vaccines are usually given at various ages, according to a schedule. Your health care provider will recommend vaccines for you based on your age, medical history, and lifestyle or other factors, such as travel or where you work. ?What tests do I need? ?Screening ?Your health care provider may recommend screening tests for certain conditions. This may include: ?Lipid and cholesterol levels. ?Diabetes  screening. This is done by checking your blood sugar (glucose) after you have not eaten for a while (fasting). ?Hepatitis B test. ?Hepatitis C test. ?HIV (human immunodeficiency virus) test. ?STI (sexually transmitted infection) testing, if you are at risk. ?Lung cancer screening. ?Prostate cancer screening. ?Colorectal cancer screening. ?Talk with your health care provider about your test results, treatment options, and if necessary, the need for more tests. ?Follow these instructions at home: ?Eating and drinking ? ?Eat a diet that includes fresh fruits and vegetables, whole grains, lean protein, and low-fat dairy products. ?Take vitamin and mineral supplements as recommended by your health care provider. ?Do not drink alcohol if your health care provider tells you not to drink. ?If you drink alcohol: ?Limit how much you have to 0-2 drinks a day. ?Know how much alcohol is in your drink. In the [place] one drink equals one 12 oz bottle of beer (355 mL), one 5 oz glass of wine (148 mL), or one 1? oz glass of hard liquor (44 mL). ?Lifestyle ?Brush your teeth every morning and night with fluoride toothpaste. Floss one time each day. ?Exercise for at least 30 minutes 5 or more days each week. ?Do not use any products that contain nicotine or tobacco. These products include cigarettes, chewing tobacco, and vaping devices, such as e-cigarettes. If you need help quitting, ask your health care provider. ?Do not use drugs. ?If you are sexually active, practice safe sex. Use a condom or other form of protection to prevent STIs. ?Take aspirin only as told by your health care provider. Make sure that you understand how much to take and what form to take. Work with your health care provider to find out whether it is  safe and beneficial for you to take aspirin daily. ?Find healthy ways to manage stress, such as: ?Meditation, yoga, or listening to music. ?Journaling. ?Talking to a trusted person. ?Spending time with friends and  family. ?Minimize exposure to UV radiation to reduce your risk of skin cancer. ?Safety ?Always wear your seat belt while driving or riding in a vehicle. ?Do not drive: ?If you have been drinking alcohol. Do not ride with someone who has been drinking. ?When you are tired or distracted. ?While texting. ?If you have been using any mind-altering substances or drugs. ?Wear a helmet and other protective equipment during sports activities. ?If you have firearms in your house, make sure you follow all gun safety procedures. ?What's next? ?Go to your health care provider once a year for an annual wellness visit. ?Ask your health care provider how often you should have your eyes and teeth checked. ?Stay up to date on all vaccines. ?This information is not intended to replace advice given to you by your health care provider. Make sure you discuss any questions you have with your health care provider. ?Document Revised: 11/20/2020 Document Reviewed: 11/20/2020 ?Elsevier Patient Education ? [employer]. ? ?

## 2021-09-02 NOTE — Assessment & Plan Note (Signed)
Hydrate and monitor 

## 2021-09-03 ENCOUNTER — Encounter: Payer: Self-pay | Admitting: Family Medicine

## 2021-09-03 DIAGNOSIS — R000 Tachycardia, unspecified: Secondary | ICD-10-CM | POA: Insufficient documentation

## 2021-09-03 DIAGNOSIS — R351 Nocturia: Secondary | ICD-10-CM | POA: Insufficient documentation

## 2021-09-03 LAB — CBC
HCT: 45.2 % (ref 39.0–52.0)
Hemoglobin: 15.2 g/dL (ref 13.0–17.0)
MCHC: 33.7 g/dL (ref 30.0–36.0)
MCV: 88.7 fl (ref 78.0–100.0)
Platelets: 267 10*3/uL (ref 150.0–400.0)
RBC: 5.1 Mil/uL (ref 4.22–5.81)
RDW: 14.6 % (ref 11.5–15.5)
WBC: 6.5 10*3/uL (ref 4.0–10.5)

## 2021-09-03 LAB — COMPREHENSIVE METABOLIC PANEL
ALT: 48 U/L (ref 0–53)
AST: 27 U/L (ref 0–37)
Albumin: 4.6 g/dL (ref 3.5–5.2)
Alkaline Phosphatase: 40 U/L (ref 39–117)
BUN: 14 mg/dL (ref 6–23)
CO2: 26 mEq/L (ref 19–32)
Calcium: 9.8 mg/dL (ref 8.4–10.5)
Chloride: 102 mEq/L (ref 96–112)
Creatinine, Ser: 1 mg/dL (ref 0.40–1.50)
GFR: 86.25 mL/min (ref >60.00)
Glucose, Bld: 93 mg/dL (ref 70–99)
Potassium: 4.2 mEq/L (ref 3.5–5.1)
Sodium: 138 mEq/L (ref 135–145)
Total Bilirubin: 0.6 mg/dL (ref 0.2–1.2)
Total Protein: 6.7 g/dL (ref 6.0–8.3)

## 2021-09-03 LAB — LIPID PANEL
Cholesterol: 130 mg/dL (ref 0–200)
HDL: 38.8 mg/dL — ABNORMAL LOW (ref >39.00)
LDL Cholesterol: 62 mg/dL (ref 0–99)
NonHDL: 91.4
Total CHOL/HDL Ratio: 3
Triglycerides: 145 mg/dL (ref 0.0–149.0)
VLDL: 29 mg/dL (ref 0.0–40.0)

## 2021-09-03 LAB — URIC ACID: Uric Acid, Serum: 5.1 mg/dL (ref 4.0–7.8)

## 2021-09-03 LAB — TSH: TSH: 1.53 u[IU]/mL (ref 0.35–5.50)

## 2021-09-03 LAB — PSA: PSA: 0.5 ng/mL (ref 0.10–4.00)

## 2021-09-03 NOTE — Assessment & Plan Note (Signed)
Stable discussed with patient if worsens then he will let us know so we can consider referral to general surgery for consultation ?

## 2021-09-03 NOTE — Assessment & Plan Note (Signed)
Improved on recheck.  

## 2021-09-03 NOTE — Assessment & Plan Note (Signed)
Maintain heart healthy DASH or MIND diet, decrease po intake and increase exercise as tolerated. Needs 7-8 hours of sleep nightly. Avoid trans fats, eat small, frequent meals every 4-5 hours with lean proteins, complex carbs and healthy fats. Minimize simple carbs, high fat foods and processed foods ?

## 2021-09-03 NOTE — Assessment & Plan Note (Signed)
Gets up some at night will check PSA to evaluate ?

## 2021-09-03 NOTE — Assessment & Plan Note (Addendum)
Encourage heart healthy diet such as MIND or DASH diet, increase exercise, avoid trans fats, simple carbohydrates and processed foods, consider a krill or fish or flaxseed oil cap daily. Tolerating Atorvastatin 

## 2021-09-05 ENCOUNTER — Other Ambulatory Visit: Payer: Self-pay

## 2021-09-05 NOTE — Telephone Encounter (Signed)
Is it okay to drop labs and schedule lab appointment? ?

## 2021-10-20 ENCOUNTER — Encounter: Payer: Self-pay | Admitting: Gastroenterology

## 2021-10-23 ENCOUNTER — Other Ambulatory Visit: Payer: Self-pay | Admitting: Family Medicine

## 2021-10-25 ENCOUNTER — Encounter: Payer: Self-pay | Admitting: Family Medicine

## 2021-10-27 ENCOUNTER — Other Ambulatory Visit: Payer: Self-pay

## 2021-10-27 ENCOUNTER — Ambulatory Visit (AMBULATORY_SURGERY_CENTER): Payer: BC Managed Care – PPO

## 2021-10-27 VITALS — Ht 70.0 in | Wt 220.0 lb

## 2021-10-27 DIAGNOSIS — Z1211 Encounter for screening for malignant neoplasm of colon: Secondary | ICD-10-CM

## 2021-10-27 MED ORDER — NA SULFATE-K SULFATE-MG SULF 17.5-3.13-1.6 GM/177ML PO SOLN: 1.0000 | Freq: Once | ORAL | 0 refills | Status: AC

## 2021-10-27 MED ORDER — ALLOPURINOL 300 MG PO TABS: ORAL_TABLET | ORAL | 1 refills | Status: DC

## 2021-10-27 NOTE — Progress Notes (Signed)
No egg or soy allergy known to patient  ?No issues known to pt with past sedation with any surgeries or procedures ?Patient denies ever being told they had issues or difficulty with intubation  ?No FH of Malignant Hyperthermia ?Pt is not on diet pills ?Pt is not on home 02  ?Pt is not on blood thinners  ?Pt denies issues with constipation at this time; ?No A fib or A flutter ?NO PA's for preps discussed with pt in PV today  ?Discussed with pt there will be an out-of-pocket cost for prep and that varies from $0 to 70 + dollars - pt verbalized understanding  ?Pt instructed to use Singlecare.com or GoodRx for a price reduction on prep  ?PV completed over the phone. Pt verified name, DOB, address and insurance during PV today.  ?Pt mailed instruction packet with copy of consent form to read and not return, and instructions.  ?Pt encouraged to call with questions or issues.  ?Pt has My chart, procedure instructions sent via My Chart  ?Insurance confirmed with pt at PV today  ? ?

## 2021-11-04 ENCOUNTER — Encounter: Payer: Self-pay | Admitting: Gastroenterology

## 2021-11-06 ENCOUNTER — Encounter: Payer: Self-pay | Admitting: Gastroenterology

## 2021-11-06 ENCOUNTER — Ambulatory Visit (AMBULATORY_SURGERY_CENTER): Payer: BC Managed Care – PPO | Admitting: Gastroenterology

## 2021-11-06 VITALS — BP 114/74 | HR 64 | Temp 96.9°F | Resp 17 | Ht 70.0 in | Wt 220.0 lb

## 2021-11-06 DIAGNOSIS — D120 Benign neoplasm of cecum: Secondary | ICD-10-CM

## 2021-11-06 DIAGNOSIS — K5730 Diverticulosis of large intestine without perforation or abscess without bleeding: Secondary | ICD-10-CM

## 2021-11-06 DIAGNOSIS — D125 Benign neoplasm of sigmoid colon: Secondary | ICD-10-CM | POA: Diagnosis not present

## 2021-11-06 DIAGNOSIS — D122 Benign neoplasm of ascending colon: Secondary | ICD-10-CM

## 2021-11-06 DIAGNOSIS — Z1211 Encounter for screening for malignant neoplasm of colon: Secondary | ICD-10-CM | POA: Diagnosis not present

## 2021-11-06 MED ORDER — SODIUM CHLORIDE 0.9 % IV SOLN: 500.0000 mL | INTRAVENOUS | Status: DC

## 2021-11-06 NOTE — Op Note (Signed)
[place] Patient Name: Michael Howe Procedure Date: 11/06/2021 9:51 AM MRN: [id number] Endoscopist: Gerrit Heck , MD Age: 51 Referring MD:  Date of Birth: March 23, 1971 Gender: Male Account #: [id number] Procedure:                Colonoscopy Indications:              Screening for colorectal malignant neoplasm, This                            is the patient's first colonoscopy Medicines:                Monitored Anesthesia Care Procedure:                Pre-Anesthesia Assessment:                           - Prior to the procedure, a History and Physical                            was performed, and patient medications and                            allergies were reviewed. The patient's tolerance of                            previous anesthesia was also reviewed. The risks                            and benefits of the procedure and the sedation                            options and risks were discussed with the patient.                            All questions were answered, and informed consent                            was obtained. Prior Anticoagulants: The patient has                            taken no previous anticoagulant or antiplatelet                            agents. ASA Grade Assessment: II - A patient with                            mild systemic disease. After reviewing the risks                            and benefits, the patient was deemed in                            satisfactory condition to undergo the procedure.  After obtaining informed consent, the colonoscope                            was passed under direct vision. Throughout the                            procedure, the patient's blood pressure, pulse, and                            oxygen saturations were monitored continuously. The                            Olympus CF-HQ190L ([id number]) Colonoscope was                            introduced through the anus and  advanced to the the                            terminal ileum. The colonoscopy was performed                            without difficulty. The patient tolerated the                            procedure well. The quality of the bowel                            preparation was good. The terminal ileum, ileocecal                            valve, appendiceal orifice, and rectum were                            photographed. Scope In: 10:03:39 AM Scope Out: 10:25:43 AM Scope Withdrawal Time: 0 hours 19 minutes 20 seconds  Total Procedure Duration: 0 hours 22 minutes 4 seconds  Findings:                 The perianal and digital rectal examinations were                            normal.                           Three sessile polyps were found in the ascending                            colon (2) and cecum (1). The polyps were 2 to 4 mm                            in size. These polyps were removed with a cold                            snare. Resection and retrieval were complete.  Estimated blood loss was minimal.                           A 15 mm polyp was found in the distal sigmoid                            colon, located 25 cm from the anal verge. The polyp                            was pedunculated. The polyp was removed with a hot                            snare. Resection and retrieval were complete.                            Estimated blood loss: none.                           Multiple small and large-mouthed diverticula were                            found in the sigmoid colon.                           The retroflexed view of the distal rectum and anal                            verge was normal and showed no anal or rectal                            abnormalities.                           The terminal ileum appeared normal. Complications:            No immediate complications. Estimated Blood Loss:     Estimated blood loss was  minimal. Impression:               - Three 2 to 4 mm polyps in the ascending colon and                            in the cecum, removed with a cold snare. Resected                            and retrieved.                           - One 15 mm polyp in the distal sigmoid colon,                            removed with a hot snare. Resected and retrieved.                           - Diverticulosis in the sigmoid colon.                           -  The distal rectum and anal verge are normal on                            retroflexion view.                           - The examined portion of the ileum was normal. Recommendation:           - Patient has a contact number available for                            emergencies. The signs and symptoms of potential                            delayed complications were discussed with the                            patient. Return to normal activities tomorrow.                            Written discharge instructions were provided to the                            patient.                           - Resume previous diet.                           - Continue present medications.                           - Await pathology results.                           - Repeat colonoscopy in 3 years for surveillance.                           - Return to GI office PRN. Gerrit Heck, MD 11/06/2021 10:30:52 AM

## 2021-11-06 NOTE — Progress Notes (Signed)
Called to room to assist during endoscopic procedure.  Patient ID and intended procedure confirmed with present staff. Received instructions for my participation in the procedure from the performing physician.  

## 2021-11-06 NOTE — Progress Notes (Signed)
Report to PACU, RN, vss, BBS= Clear.  

## 2021-11-06 NOTE — Patient Instructions (Signed)
YOU HAD AN ENDOSCOPIC PROCEDURE TODAY AT THE [facility]:   Refer to the procedure report that was given to you for any specific questions about what was found during the examination.  If the procedure report does not answer your questions, please call your gastroenterologist to clarify.  If you requested that your care partner not be given the details of your procedure findings, then the procedure report has been included in a sealed envelope for you to review at your convenience later.  YOU SHOULD EXPECT: Some feelings of bloating in the abdomen. Passage of more gas than usual.  Walking can help get rid of the air that was put into your GI tract during the procedure and reduce the bloating. If you had a lower endoscopy (such as a colonoscopy or flexible sigmoidoscopy) you may notice spotting of blood in your stool or on the toilet paper. If you underwent a bowel prep for your procedure, you may not have a normal bowel movement for a few days.  Please Note:  You might notice some irritation and congestion in your nose or some drainage.  This is from the oxygen used during your procedure.  There is no need for concern and it should clear up in a day or so.  SYMPTOMS TO REPORT IMMEDIATELY:   Following lower endoscopy (colonoscopy or flexible sigmoidoscopy):  Excessive amounts of blood in the stool  Significant tenderness or worsening of abdominal pains  Swelling of the abdomen that is new, acute  Fever of 100F or higher   Following upper endoscopy (EGD)  Vomiting of blood or coffee ground material  New chest pain or pain under the shoulder blades  Painful or persistently difficult swallowing  New shortness of breath  Fever of 100F or higher  Black, tarry-looking stools  For urgent or emergent issues, a gastroenterologist can be reached at any hour by calling [phone]. Do not use MyChart messaging for urgent concerns.    DIET:  We do recommend a small meal at first, but  then you may proceed to your regular diet.  Drink plenty of fluids but you should avoid alcoholic beverages for 24 hours.  ACTIVITY:  You should plan to take it easy for the rest of today and you should NOT DRIVE or use heavy machinery until tomorrow (because of the sedation medicines used during the test).    FOLLOW UP: Our staff will call the number listed on your records 48-72 hours following your procedure to check on you and address any questions or concerns that you may have regarding the information given to you following your procedure. If we do not reach you, we will leave a message.  We will attempt to reach you two times.  During this call, we will ask if you have developed any symptoms of COVID 19. If you develop any symptoms (ie: fever, flu-like symptoms, shortness of breath, cough etc.) before then, please call [phone].  If you test positive for Covid 19 in the 2 weeks post procedure, please call and report this information to us.    If any biopsies were taken you will be contacted by phone or by letter within the next 1-3 weeks.  Please call us at [phone] if you have not heard about the biopsies in 3 weeks.    SIGNATURES/CONFIDENTIALITY: You and/or your care partner have signed paperwork which will be entered into your electronic medical record.  These signatures attest to the fact that that the information above on   your After Visit Summary has been reviewed and is understood.  Full responsibility of the confidentiality of this discharge information lies with you and/or your care-partner. 

## 2021-11-06 NOTE — Progress Notes (Signed)
Pt's states no medical or surgical changes since previsit or office visit. 

## 2021-11-06 NOTE — Progress Notes (Signed)
GASTROENTEROLOGY PROCEDURE H&P NOTE   Primary Care Physician: Mosie Lukes, MD    Reason for Procedure:  Colon Cancer screening  Plan:    Colonoscopy  Patient is appropriate for endoscopic procedure(s) in the ambulatory ([place]) setting.  The nature of the procedure, as well as the risks, benefits, and alternatives were carefully and thoroughly reviewed with the patient. Ample time for discussion and questions allowed. The patient understood, was satisfied, and agreed to proceed.     HPI: Michael Howe is a 51 y.o. male who presents for colonoscopy for routine Colon Cancer screening.  No active GI symptoms.  No known family history of colon cancer or related malignancy.  Patient is otherwise without complaints or active issues today.  Past Medical History:  Diagnosis Date   Allergic state 02/01/2012   Allergy    Cholelithiasis 03/10/2015   Dyslipidemia 07/26/2013   GERD (gastroesophageal reflux disease)    uses PRN OTC medications   Gout    Hyperlipidemia    on meds   Obesity 06/30/2016   Preventative health care 07/14/2012   Renal lithiasis 11/10/2015   hx of kidney stones   Umbilical hernia 07/37/1062    Past Surgical History:  Procedure Laterality Date   TONSILLECTOMY AND ADENOIDECTOMY  06/08/1973   WISDOM TOOTH EXTRACTION      Prior to Admission medications   Medication Sig Start Date End Date Taking? Authorizing Provider  allopurinol (ZYLOPRIM) 300 MG tablet TAKE 1 TABLET BY MOUTH EVERY DAY. OUT OF NETWORK 10/27/21  Yes Mosie Lukes, MD  atorvastatin (LIPITOR) 10 MG tablet Take 1 tablet (10 mg total) by mouth daily. 01/20/21  Yes Shelda Pal, DO  famotidine (PEPCID) 40 MG tablet Take 1 tablet (40 mg total) by mouth 2 (two) times daily. Patient taking differently: Take 40 mg by mouth 2 (two) times daily as needed. 05/17/20  Yes Mosie Lukes, MD  fluticasone (FLONASE) 50 MCG/ACT nasal spray Place 2 sprays into both nostrils daily as needed  for allergies or rhinitis. Patient taking differently: Place 2 sprays into both nostrils daily. 07/26/13  Yes Mosie Lukes, MD  levocetirizine (XYZAL) 5 MG tablet Take 5 mg by mouth every evening.   Yes [provider]  albuterol (VENTOLIN HFA) 108 (90 Base) MCG/ACT inhaler Inhale 2 puffs into the lungs every 6 (six) hours as needed for wheezing or shortness of breath. 05/17/20   Mosie Lukes, MD  colchicine 0.6 MG tablet 2 tabs po once then 1 tab po q 2 hours prn pain til pain gone, max of 6 tabs in 24 hours or intolerable diarrhea 01/20/21   Shelda Pal, DO  tadalafil (CIALIS) 10 MG tablet Take 1-2 tablets (10-20 mg total) by mouth daily as needed for erectile dysfunction. 01/15/21   Mosie Lukes, MD    Current Outpatient Medications  Medication Sig Dispense Refill   allopurinol (ZYLOPRIM) 300 MG tablet TAKE 1 TABLET BY MOUTH EVERY DAY. OUT OF NETWORK 90 tablet 1   atorvastatin (LIPITOR) 10 MG tablet Take 1 tablet (10 mg total) by mouth daily. 90 tablet 2   famotidine (PEPCID) 40 MG tablet Take 1 tablet (40 mg total) by mouth 2 (two) times daily. (Patient taking differently: Take 40 mg by mouth 2 (two) times daily as needed.) 60 tablet 1   fluticasone (FLONASE) 50 MCG/ACT nasal spray Place 2 sprays into both nostrils daily as needed for allergies or rhinitis. (Patient taking differently: Place 2 sprays into both  nostrils daily.) 16 g 2   levocetirizine (XYZAL) 5 MG tablet Take 5 mg by mouth every evening.     albuterol (VENTOLIN HFA) 108 (90 Base) MCG/ACT inhaler Inhale 2 puffs into the lungs every 6 (six) hours as needed for wheezing or shortness of breath. 18 g 1   colchicine 0.6 MG tablet 2 tabs po once then 1 tab po q 2 hours prn pain til pain gone, max of 6 tabs in 24 hours or intolerable diarrhea 30 tablet 1   tadalafil (CIALIS) 10 MG tablet Take 1-2 tablets (10-20 mg total) by mouth daily as needed for erectile dysfunction. 30 tablet 0   No current  facility-administered medications for this visit.    Allergies as of 11/06/2021 - Review Complete 11/06/2021  Allergen Reaction Noted   Pollen extract  01/14/2020    Family History  Problem Relation Age of Onset   Hyperlipidemia Mother    Diabetes Mother    Bowel Disease Mother        IBS   Alcohol abuse Father    Heart disease Sister    Gout Maternal Grandmother    Heart disease Maternal Grandmother    Cancer Maternal Grandmother    Stroke Paternal Grandmother    Autism Son    Colon polyps Neg Hx    Colon cancer Neg Hx    Esophageal cancer Neg Hx    Rectal cancer Neg Hx    Stomach cancer Neg Hx     Social History   Socioeconomic History   Marital status: Married    Spouse name: Not on file   Number of children: Not on file   Years of education: Not on file   Highest education level: Not on file  Occupational History   Not on file  Tobacco Use   Smoking status: Never   Smokeless tobacco: Never  Vaping Use   Vaping Use: Never used  Substance and Sexual Activity   Alcohol use: No   Drug use: No   Sexual activity: Yes    Comment: 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, dispensing, Freight forwarder in [employer], no dietary restrictions  Other Topics Concern   Not on file  Social History Narrative   Not on file   Social Determinants of Health   Financial Resource Strain: Not on file  Food Insecurity: Not on file  Transportation Needs: Not on file  Physical Activity: Not on file  Stress: Not on file  Social Connections: Not on file  Intimate Partner Violence: Not on file    Physical Exam: Vital signs in last 24 hours: '@BP'$  (!) 89/57   Pulse 73   Temp (!) 96.9 F (36.1 C)   Ht '5\' 10"'$  (1.778 m)   Wt 220 lb (99.8 kg)   SpO2 96%   BMI 31.57 kg/m  GEN: NAD EYE: Sclerae anicteric ENT: MMM CV: Non-tachycardic Pulm: CTA b/l GI: Soft, NT/ND NEURO:  Alert & Oriented x 3   Gerrit Heck, DO [facility]   11/06/2021 9:56 AM

## 2021-11-07 ENCOUNTER — Telehealth: Payer: Self-pay | Admitting: *Deleted

## 2021-11-07 NOTE — Telephone Encounter (Signed)
  Follow up Call-     11/06/2021    9:51 AM  Call back number  Post procedure Call Back phone  # [phone]  Permission to leave phone message Yes     Patient questions:  Do you have a fever, pain , or abdominal swelling? No. Pain Score  0 *  Have you tolerated food without any problems? Yes.    Have you been able to return to your normal activities? Yes.    Do you have any questions about your discharge instructions: Diet   No. Medications  No. Follow up visit  No.  Do you have questions or concerns about your Care? No.  Actions: * If pain score is 4 or above: No action needed, pain <4.

## 2021-11-08 ENCOUNTER — Other Ambulatory Visit: Payer: Self-pay | Admitting: Family Medicine

## 2021-11-24 ENCOUNTER — Encounter: Payer: Self-pay | Admitting: Family Medicine

## 2021-12-18 ENCOUNTER — Other Ambulatory Visit: Payer: Self-pay | Admitting: Family Medicine

## 2021-12-18 DIAGNOSIS — E785 Hyperlipidemia, unspecified: Secondary | ICD-10-CM

## 2022-01-23 ENCOUNTER — Other Ambulatory Visit: Payer: Self-pay | Admitting: Family Medicine

## 2022-01-23 ENCOUNTER — Encounter: Payer: Self-pay | Admitting: Family Medicine

## 2022-01-23 DIAGNOSIS — J019 Acute sinusitis, unspecified: Secondary | ICD-10-CM | POA: Diagnosis not present

## 2022-01-23 MED ORDER — AZITHROMYCIN 250 MG PO TABS: ORAL_TABLET | ORAL | 0 refills | Status: AC

## 2022-04-25 ENCOUNTER — Other Ambulatory Visit: Payer: Self-pay | Admitting: Family Medicine

## 2022-05-22 ENCOUNTER — Encounter: Payer: Self-pay | Admitting: Family Medicine

## 2022-05-26 ENCOUNTER — Other Ambulatory Visit: Payer: Self-pay | Admitting: Family Medicine

## 2022-05-27 MED ORDER — TADALAFIL 10 MG PO TABS: 10.0000 mg | ORAL_TABLET | Freq: Every day | ORAL | 0 refills | Status: DC | PRN

## 2022-07-05 ENCOUNTER — Encounter: Payer: Self-pay | Admitting: Family Medicine

## 2022-07-06 ENCOUNTER — Other Ambulatory Visit: Payer: Self-pay

## 2022-07-06 ENCOUNTER — Other Ambulatory Visit: Payer: Self-pay | Admitting: Family Medicine

## 2022-07-06 DIAGNOSIS — M1A9XX0 Chronic gout, unspecified, without tophus (tophi): Secondary | ICD-10-CM

## 2022-07-06 MED ORDER — COLCHICINE 0.6 MG PO TABS
ORAL_TABLET | ORAL | 1 refills | Status: AC
Start: 2022-07-06 — End: ?

## 2022-08-28 ENCOUNTER — Encounter: Payer: Self-pay | Admitting: Family Medicine

## 2022-09-06 DIAGNOSIS — Z86010 Personal history of colonic polyps: Secondary | ICD-10-CM | POA: Insufficient documentation

## 2022-09-06 NOTE — Assessment & Plan Note (Deleted)
Hydrate and monitor 

## 2022-09-06 NOTE — Assessment & Plan Note (Deleted)
Encouraged DASH or MIND diet, decrease po intake and increase exercise as tolerated. Needs 7-8 hours of sleep nightly. Avoid trans fats, eat small, frequent meals every 4-5 hours with lean proteins, complex carbs and healthy fats. Minimize simple carbs, high fat foods and processed foods 

## 2022-09-06 NOTE — Assessment & Plan Note (Deleted)
Encourage heart healthy diet such as MIND or DASH diet, increase exercise, avoid trans fats, simple carbohydrates and processed foods, consider a krill or fish or flaxseed oil cap daily. Tolerating Atorvastatin 

## 2022-09-06 NOTE — Assessment & Plan Note (Deleted)
Colonoscopy 11/2021 numerous polyps

## 2022-09-06 NOTE — Assessment & Plan Note (Deleted)
Patient encouraged to maintain heart healthy diet, regular exercise, adequate sleep. Consider daily probiotics. Take medications as prescribed. Labs ordered and reviewed. Colonoscopy 11/2021 repeat in 2026

## 2022-09-07 ENCOUNTER — Encounter: Payer: BC Managed Care – PPO | Admitting: Family Medicine

## 2022-09-07 DIAGNOSIS — Z0000 Encounter for general adult medical examination without abnormal findings: Secondary | ICD-10-CM

## 2022-09-07 DIAGNOSIS — Z86010 Personal history of colonic polyps: Secondary | ICD-10-CM

## 2022-09-07 DIAGNOSIS — M1A9XX0 Chronic gout, unspecified, without tophus (tophi): Secondary | ICD-10-CM

## 2022-09-07 DIAGNOSIS — E785 Hyperlipidemia, unspecified: Secondary | ICD-10-CM

## 2022-09-07 DIAGNOSIS — E6609 Other obesity due to excess calories: Secondary | ICD-10-CM

## 2022-09-22 ENCOUNTER — Other Ambulatory Visit: Payer: Self-pay

## 2022-09-22 DIAGNOSIS — E785 Hyperlipidemia, unspecified: Secondary | ICD-10-CM

## 2022-09-22 MED ORDER — ATORVASTATIN CALCIUM 10 MG PO TABS: 10.0000 mg | ORAL_TABLET | Freq: Every day | ORAL | 2 refills | Status: DC

## 2022-09-29 ENCOUNTER — Encounter: Payer: Self-pay | Admitting: Family Medicine

## 2022-09-29 ENCOUNTER — Ambulatory Visit (INDEPENDENT_AMBULATORY_CARE_PROVIDER_SITE_OTHER): Payer: BC Managed Care – PPO | Admitting: Family Medicine

## 2022-09-29 VITALS — BP 132/77 | HR 75 | Ht 70.0 in | Wt 239.0 lb

## 2022-09-29 DIAGNOSIS — E785 Hyperlipidemia, unspecified: Secondary | ICD-10-CM

## 2022-09-29 DIAGNOSIS — Z131 Encounter for screening for diabetes mellitus: Secondary | ICD-10-CM

## 2022-09-29 DIAGNOSIS — K429 Umbilical hernia without obstruction or gangrene: Secondary | ICD-10-CM

## 2022-09-29 DIAGNOSIS — Z125 Encounter for screening for malignant neoplasm of prostate: Secondary | ICD-10-CM | POA: Diagnosis not present

## 2022-09-29 DIAGNOSIS — N529 Male erectile dysfunction, unspecified: Secondary | ICD-10-CM | POA: Diagnosis not present

## 2022-09-29 DIAGNOSIS — Z0000 Encounter for general adult medical examination without abnormal findings: Secondary | ICD-10-CM

## 2022-09-29 DIAGNOSIS — Z23 Encounter for immunization: Secondary | ICD-10-CM

## 2022-09-29 DIAGNOSIS — Z1329 Encounter for screening for other suspected endocrine disorder: Secondary | ICD-10-CM

## 2022-09-29 LAB — COMPREHENSIVE METABOLIC PANEL
ALT: 43 U/L (ref 0–53)
AST: 27 U/L (ref 0–37)
Albumin: 4.4 g/dL (ref 3.5–5.2)
Alkaline Phosphatase: 43 U/L (ref 39–117)
BUN: 11 mg/dL (ref 6–23)
CO2: 27 mEq/L (ref 19–32)
Calcium: 9.6 mg/dL (ref 8.4–10.5)
Chloride: 101 mEq/L (ref 96–112)
Creatinine, Ser: 0.99 mg/dL (ref 0.40–1.50)
GFR: 86.64 mL/min (ref >60.00)
Glucose, Bld: 100 mg/dL — ABNORMAL HIGH (ref 70–99)
Potassium: 4.2 mEq/L (ref 3.5–5.1)
Sodium: 137 mEq/L (ref 135–145)
Total Bilirubin: 0.9 mg/dL (ref 0.2–1.2)
Total Protein: 6.8 g/dL (ref 6.0–8.3)

## 2022-09-29 LAB — LIPID PANEL
Cholesterol: 123 mg/dL (ref 0–200)
HDL: 34.7 mg/dL — ABNORMAL LOW (ref >39.00)
LDL Cholesterol: 59 mg/dL (ref 0–99)
NonHDL: 88.03
Total CHOL/HDL Ratio: 4
Triglycerides: 144 mg/dL (ref 0.0–149.0)
VLDL: 28.8 mg/dL (ref 0.0–40.0)

## 2022-09-29 LAB — CBC WITH DIFFERENTIAL/PLATELET
Basophils Absolute: 0.1 10*3/uL (ref 0.0–0.1)
Basophils Relative: 1 % (ref 0.0–3.0)
Eosinophils Absolute: 0.1 10*3/uL (ref 0.0–0.7)
Eosinophils Relative: 1.5 % (ref 0.0–5.0)
HCT: 46 % (ref 39.0–52.0)
Hemoglobin: 15.6 g/dL (ref 13.0–17.0)
Lymphocytes Relative: 23.9 % (ref 12.0–46.0)
Lymphs Abs: 1.3 10*3/uL (ref 0.7–4.0)
MCHC: 33.9 g/dL (ref 30.0–36.0)
MCV: 88 fl (ref 78.0–100.0)
Monocytes Absolute: 0.7 10*3/uL (ref 0.1–1.0)
Monocytes Relative: 12.6 % — ABNORMAL HIGH (ref 3.0–12.0)
Neutro Abs: 3.3 10*3/uL (ref 1.4–7.7)
Neutrophils Relative %: 61 % (ref 43.0–77.0)
Platelets: 263 10*3/uL (ref 150.0–400.0)
RBC: 5.22 Mil/uL (ref 4.22–5.81)
RDW: 14.2 % (ref 11.5–15.5)
WBC: 5.4 10*3/uL (ref 4.0–10.5)

## 2022-09-29 LAB — TSH: TSH: 1.36 u[IU]/mL (ref 0.35–5.50)

## 2022-09-29 LAB — PSA: PSA: 0.56 ng/mL (ref 0.10–4.00)

## 2022-09-29 MED ORDER — TADALAFIL 10 MG PO TABS
10.0000 mg | ORAL_TABLET | Freq: Every day | ORAL | 0 refills | Status: DC | PRN
Start: 2022-09-29 — End: 2023-04-19

## 2022-09-29 NOTE — Assessment & Plan Note (Signed)
Reports hernia is not looking any different, no signs of obstruction, however it is starting to be a little tender at times. No severe pain. He would like to discuss repair. General surgery consult placed.  Patient aware of signs/symptoms requiring further/urgent evaluation.

## 2022-09-29 NOTE — Progress Notes (Signed)
Complete physical exam  Patient: Michael Howe   DOB: 07-12-1970   52 y.o. Male  MRN: [id number]  Subjective:    Chief Complaint  Patient presents with   Annual Exam    Michael Howe is a 52 y.o. male who presents today for a complete physical exam. He reports consuming a general diet. Home exercise routine includes running at least 3x/week. He generally feels well. He reports sleeping well. He does not have additional problems to discuss today.   Currently lives with: wife, daughter and son Interim Problems from last visit:  - He would like to discuss having is umbilical hernia repaired. States it is starting to get annoying and occasionally tender. No severe pain or signs of obstruction or infection.   Vision concerns: none Dental concerns: none STD concerns: none  Patient denies ETOH use. Patient denies nicotine use. Patient denies illegal substance use.    PSA:  The natural history of prostate cancer and ongoing controversy regarding screening and potential treatment outcomes of prostate cancer has been discussed with the patient. The meaning of a false positive PSA and a false negative PSA has been discussed. He indicates understanding of the limitations of this screening test and wishes  to proceed with screening PSA testing.      Most recent fall risk assessment:    09/29/2022    9:17 AM  Fall Risk   Falls in the past year? 0  Number falls in past yr: 0  Injury with Fall? 0  Risk for fall due to : No Fall Risks  Follow up Falls evaluation completed     Most recent depression screenings:    09/29/2022    9:42 AM 09/02/2021    2:22 PM  PHQ 2/9 Scores  PHQ - 2 Score 0 0  PHQ- 9 Score 0             Patient Care Team: Bradd Canary, MD as PCP - General (Family Medicine)   Outpatient Medications Prior to Visit  Medication Sig   albuterol (VENTOLIN HFA) 108 (90 Base) MCG/ACT inhaler Inhale 2 puffs into the lungs every 6 (six) hours as needed for  wheezing or shortness of breath.   allopurinol (ZYLOPRIM) 300 MG tablet Take 1 tablet (300 mg total) by mouth daily.   atorvastatin (LIPITOR) 10 MG tablet Take 1 tablet (10 mg total) by mouth daily.   colchicine 0.6 MG tablet 2 tabs po once then 1 tab po q 2 hours prn pain til pain gone, max of 6 tabs in 24 hours or intolerable diarrhea   famotidine (PEPCID) 40 MG tablet Take 1 tablet (40 mg total) by mouth 2 (two) times daily. (Patient taking differently: Take 40 mg by mouth 2 (two) times daily as needed.)   fluticasone (FLONASE) 50 MCG/ACT nasal spray Place 2 sprays into both nostrils daily as needed for allergies or rhinitis. (Patient taking differently: Place 2 sprays into both nostrils daily.)   levocetirizine (XYZAL) 5 MG tablet Take 5 mg by mouth every evening.   OVER THE COUNTER MEDICATION Liver MD supplement - daily   [DISCONTINUED] tadalafil (CIALIS) 10 MG tablet Take 1 tablet (10 mg total) by mouth daily as needed for erectile dysfunction.   No facility-administered medications prior to visit.    ROS All review of systems negative except what is listed in the HPI        Objective:     BP 132/77   Pulse 75   Ht   (1.778 m)   Wt 239 lb (108.4 kg)   SpO2 96%   BMI 34.29 kg/m    Physical Exam Vitals reviewed.  Constitutional:      General: He is not in acute distress.    Appearance: Normal appearance. He is not ill-appearing.  HENT:     Head: Normocephalic and atraumatic.     Right Ear: Tympanic membrane normal.     Left Ear: Tympanic membrane normal.     Nose: Nose normal.     Mouth/Throat:     Mouth: Mucous membranes are moist.     Pharynx: Oropharynx is clear.  Eyes:     Extraocular Movements: Extraocular movements intact.     Conjunctiva/sclera: Conjunctivae normal.     Pupils: Pupils are equal, round, and reactive to light.  Neck:     Vascular: No carotid bruit.  Cardiovascular:     Rate and Rhythm: Normal rate and regular rhythm.     Pulses:  Normal pulses.     Heart sounds: Normal heart sounds.  Pulmonary:     Effort: Pulmonary effort is normal.     Breath sounds: Normal breath sounds.  Abdominal:     General: Abdomen is flat. Bowel sounds are normal. There is no distension.     Palpations: Abdomen is soft. There is no mass.     Tenderness: There is no abdominal tenderness. There is no right CVA tenderness, left CVA tenderness, guarding or rebound.     Hernia: A hernia is present. Hernia is present in the umbilical area.  Genitourinary:    Comments: Deferred exam Musculoskeletal:        General: Normal range of motion.     Cervical back: Normal range of motion and neck supple. No tenderness.     Right lower leg: No edema.     Left lower leg: No edema.  Lymphadenopathy:     Cervical: No cervical adenopathy.  Skin:    General: Skin is warm and dry.     Capillary Refill: Capillary refill takes less than 2 seconds.  Neurological:     General: No focal deficit present.     Mental Status: He is alert and oriented to person, place, and time. Mental status is at baseline.  Psychiatric:        Mood and Affect: Mood normal.        Behavior: Behavior normal.        Thought Content: Thought content normal.        Judgment: Judgment normal.        No results found for any visits on 09/29/22.     Assessment & Plan:    Routine Health Maintenance and Physical Exam Discussed health promotion and safety including diet and exercise recommendations, dental health, and injury prevention. Tobacco cessation if applicable. Seat belts, sunscreen, smoke detectors, etc.    Immunization History  Administered Date(s) Administered   COVID-19, mRNA, vaccine(Comirnaty)12 years and older 05/22/2022   Influenza,inj,Quad PF,6+ Mos 03/05/2015, 06/30/2016, 07/22/2018   Influenza-Unspecified 05/06/2021, 05/22/2022   Moderna Covid-19 Vaccine Bivalent Booster 41yrs & up 05/06/2021   Moderna Sars-Covid-2 Vaccination 07/22/2019, 08/19/2019    Tdap 09/29/2022   Zoster Recombinat (Shingrix) 05/22/2022    Health Maintenance  Topic Date Due   HIV Screening  Never done   Zoster Vaccines- Shingrix (2 of 2) 12/29/2022 (Originally 07/17/2022)   INFLUENZA VACCINE  01/07/2023   COLONOSCOPY (Pts 45-89yrs Insurance coverage will need to be confirmed)  11/06/2024   DTaP/Tdap/Td (  2 - Td or Tdap) 09/28/2032   COVID-19 Vaccine  Completed   Hepatitis C Screening  Completed   HPV VACCINES  Aged Out        Problem List Items Addressed This Visit     ED (erectile dysfunction)   Relevant Medications   tadalafil (CIALIS) 10 MG tablet   Preventative health care - Primary (Chronic)   Relevant Orders   CBC with Differential/Platelet   Comprehensive metabolic panel   Lipid panel   PSA   TSH   Dyslipidemia   Relevant Orders   Lipid panel   Umbilical hernia    Reports hernia is not looking any different, no signs of obstruction, however it is starting to be a little tender at times. No severe pain. He would like to discuss repair. General surgery consult placed.  Patient aware of signs/symptoms requiring further/urgent evaluation.       Relevant Orders   Ambulatory referral to General Surgery   Other Visit Diagnoses     Diabetes mellitus screening       Relevant Orders   Comprehensive metabolic panel   Prostate cancer screening       Relevant Orders   PSA   Thyroid disorder screen       Relevant Orders   TSH   Need for Tdap vaccination       Relevant Orders   Tdap vaccine greater than or equal to 7yo IM (Completed)      Return in about 1 year (around 09/29/2023) for physical.     Clayborne Dana, NP

## 2022-10-20 ENCOUNTER — Other Ambulatory Visit: Payer: Self-pay | Admitting: Family Medicine

## 2022-10-26 DIAGNOSIS — K429 Umbilical hernia without obstruction or gangrene: Secondary | ICD-10-CM | POA: Diagnosis not present

## 2023-04-19 ENCOUNTER — Other Ambulatory Visit: Payer: Self-pay | Admitting: Family Medicine

## 2023-04-19 DIAGNOSIS — N529 Male erectile dysfunction, unspecified: Secondary | ICD-10-CM

## 2023-04-19 MED ORDER — TADALAFIL 10 MG PO TABS
10.0000 mg | ORAL_TABLET | Freq: Every day | ORAL | 0 refills | Status: DC | PRN
Start: 2023-04-19 — End: 2023-12-13

## 2023-06-18 ENCOUNTER — Other Ambulatory Visit: Payer: Self-pay | Admitting: Family Medicine

## 2023-06-18 DIAGNOSIS — E785 Hyperlipidemia, unspecified: Secondary | ICD-10-CM

## 2023-07-31 DIAGNOSIS — R0981 Nasal congestion: Secondary | ICD-10-CM | POA: Diagnosis not present

## 2023-07-31 DIAGNOSIS — R051 Acute cough: Secondary | ICD-10-CM | POA: Diagnosis not present

## 2023-07-31 DIAGNOSIS — J019 Acute sinusitis, unspecified: Secondary | ICD-10-CM | POA: Diagnosis not present

## 2023-07-31 DIAGNOSIS — J209 Acute bronchitis, unspecified: Secondary | ICD-10-CM | POA: Diagnosis not present

## 2023-10-03 NOTE — Assessment & Plan Note (Signed)
Maintain heart healthy DASH or MIND diet, decrease po intake and increase exercise as tolerated. Needs 7-8 hours of sleep nightly. Avoid trans fats, eat small, frequent meals every 4-5 hours with lean proteins, complex carbs and healthy fats. Minimize simple carbs, high fat foods and processed foods ?

## 2023-10-03 NOTE — Assessment & Plan Note (Signed)
 Patient encouraged to maintain heart healthy diet, regular exercise, adequate sleep. Consider daily probiotics. Take medications as prescribed. Labs ordered and reviewed.  Colonoscopy 11/2021, repeat in 2026 Shingrix #1 2023

## 2023-10-03 NOTE — Assessment & Plan Note (Signed)
Cialis prn 

## 2023-10-03 NOTE — Assessment & Plan Note (Signed)
 Encourage heart healthy diet such as MIND or DASH diet, increase exercise, avoid trans fats, simple carbohydrates and processed foods, consider a krill or fish or flaxseed oil cap daily. Tolerating Atorvastatin

## 2023-10-04 ENCOUNTER — Encounter: Payer: Self-pay | Admitting: Family Medicine

## 2023-10-04 ENCOUNTER — Ambulatory Visit (INDEPENDENT_AMBULATORY_CARE_PROVIDER_SITE_OTHER): Payer: BC Managed Care – PPO | Admitting: Family Medicine

## 2023-10-04 VITALS — BP 118/82 | HR 78 | Temp 97.6°F | Resp 16 | Ht 70.0 in | Wt 242.2 lb

## 2023-10-04 DIAGNOSIS — M1A9XX0 Chronic gout, unspecified, without tophus (tophi): Secondary | ICD-10-CM | POA: Diagnosis not present

## 2023-10-04 DIAGNOSIS — Z125 Encounter for screening for malignant neoplasm of prostate: Secondary | ICD-10-CM | POA: Diagnosis not present

## 2023-10-04 DIAGNOSIS — L989 Disorder of the skin and subcutaneous tissue, unspecified: Secondary | ICD-10-CM

## 2023-10-04 DIAGNOSIS — R739 Hyperglycemia, unspecified: Secondary | ICD-10-CM | POA: Diagnosis not present

## 2023-10-04 DIAGNOSIS — E785 Hyperlipidemia, unspecified: Secondary | ICD-10-CM | POA: Diagnosis not present

## 2023-10-04 DIAGNOSIS — Z0001 Encounter for general adult medical examination with abnormal findings: Secondary | ICD-10-CM | POA: Diagnosis not present

## 2023-10-04 DIAGNOSIS — N529 Male erectile dysfunction, unspecified: Secondary | ICD-10-CM | POA: Diagnosis not present

## 2023-10-04 DIAGNOSIS — Z23 Encounter for immunization: Secondary | ICD-10-CM

## 2023-10-04 DIAGNOSIS — Z0000 Encounter for general adult medical examination without abnormal findings: Secondary | ICD-10-CM

## 2023-10-04 DIAGNOSIS — E6609 Other obesity due to excess calories: Secondary | ICD-10-CM

## 2023-10-04 DIAGNOSIS — L578 Other skin changes due to chronic exposure to nonionizing radiation: Secondary | ICD-10-CM | POA: Diagnosis not present

## 2023-10-04 LAB — HEMOGLOBIN A1C: Hgb A1c MFr Bld: 6.2 % (ref 4.6–6.5)

## 2023-10-04 LAB — CBC WITH DIFFERENTIAL/PLATELET
Basophils Absolute: 0.1 10*3/uL (ref 0.0–0.1)
Basophils Relative: 1.2 % (ref 0.0–3.0)
Eosinophils Absolute: 0.1 10*3/uL (ref 0.0–0.7)
Eosinophils Relative: 1.4 % (ref 0.0–5.0)
HCT: 46.5 % (ref 39.0–52.0)
Hemoglobin: 15.5 g/dL (ref 13.0–17.0)
Lymphocytes Relative: 28.1 % (ref 12.0–46.0)
Lymphs Abs: 1.5 10*3/uL (ref 0.7–4.0)
MCHC: 33.3 g/dL (ref 30.0–36.0)
MCV: 89.9 fl (ref 78.0–100.0)
Monocytes Absolute: 0.6 10*3/uL (ref 0.1–1.0)
Monocytes Relative: 11.5 % (ref 3.0–12.0)
Neutro Abs: 3.1 10*3/uL (ref 1.4–7.7)
Neutrophils Relative %: 57.8 % (ref 43.0–77.0)
Platelets: 256 10*3/uL (ref 150.0–400.0)
RBC: 5.17 Mil/uL (ref 4.22–5.81)
RDW: 14.5 % (ref 11.5–15.5)
WBC: 5.3 10*3/uL (ref 4.0–10.5)

## 2023-10-04 LAB — LIPID PANEL
Cholesterol: 136 mg/dL (ref 0–200)
HDL: 35.4 mg/dL — ABNORMAL LOW (ref >39.00)
LDL Cholesterol: 66 mg/dL (ref 0–99)
NonHDL: 100.86
Total CHOL/HDL Ratio: 4
Triglycerides: 176 mg/dL — ABNORMAL HIGH (ref 0.0–149.0)
VLDL: 35.2 mg/dL (ref 0.0–40.0)

## 2023-10-04 LAB — COMPREHENSIVE METABOLIC PANEL WITH GFR
ALT: 45 U/L (ref 0–53)
AST: 26 U/L (ref 0–37)
Albumin: 4.5 g/dL (ref 3.5–5.2)
Alkaline Phosphatase: 40 U/L (ref 39–117)
BUN: 11 mg/dL (ref 6–23)
CO2: 27 meq/L (ref 19–32)
Calcium: 9.5 mg/dL (ref 8.4–10.5)
Chloride: 103 meq/L (ref 96–112)
Creatinine, Ser: 0.91 mg/dL (ref 0.40–1.50)
GFR: 95.18 mL/min (ref >60.00)
Glucose, Bld: 102 mg/dL — ABNORMAL HIGH (ref 70–99)
Potassium: 4.2 meq/L (ref 3.5–5.1)
Sodium: 139 meq/L (ref 135–145)
Total Bilirubin: 0.7 mg/dL (ref 0.2–1.2)
Total Protein: 6.9 g/dL (ref 6.0–8.3)

## 2023-10-04 LAB — TSH: TSH: 1.48 u[IU]/mL (ref 0.35–5.50)

## 2023-10-04 LAB — PSA: PSA: 0.59 ng/mL (ref 0.10–4.00)

## 2023-10-04 LAB — URIC ACID: Uric Acid, Serum: 5.3 mg/dL (ref 4.0–7.8)

## 2023-10-04 NOTE — Patient Instructions (Addendum)
 RSV, Respiratory Syncitial Virus Vaccine, Arexvy at pharmacy Prevnar 20 vaccine at pharmacy Shingrix #2  Preventive Care 55-55 Years Old, Male Preventive care refers to lifestyle choices and visits with your health care provider that can promote health and wellness. Preventive care visits are also called wellness exams. What can I expect for my preventive care visit? Counseling During your preventive care visit, your health care provider may ask about your: Medical history, including: Past medical problems. Family medical history. Current health, including: Emotional well-being. Home life and relationship well-being. Sexual activity. Lifestyle, including: Alcohol, nicotine or tobacco, and drug use. Access to firearms. Diet, exercise, and sleep habits. Safety issues such as seatbelt and bike helmet use. Sunscreen use. Work and work Astronomer. Physical exam Your health care provider will check your: Height and weight. These may be used to calculate your BMI (body mass index). BMI is a measurement that tells if you are at a healthy weight. Waist circumference. This measures the distance around your waistline. This measurement also tells if you are at a healthy weight and may help predict your risk of certain diseases, such as type 2 diabetes and high blood pressure. Heart rate and blood pressure. Body temperature. Skin for abnormal spots. What immunizations do I need?  Vaccines are usually given at various ages, according to a schedule. Your health care provider will recommend vaccines for you based on your age, medical history, and lifestyle or other factors, such as travel or where you work. What tests do I need? Screening Your health care provider may recommend screening tests for certain conditions. This may include: Lipid and cholesterol levels. Diabetes screening. This is done by checking your blood sugar (glucose) after you have not eaten for a while (fasting). Hepatitis B  test. Hepatitis C test. HIV (human immunodeficiency virus) test. STI (sexually transmitted infection) testing, if you are at risk. Lung cancer screening. Prostate cancer screening. Colorectal cancer screening. Talk with your health care provider about your test results, treatment options, and if necessary, the need for more tests. Follow these instructions at home: Eating and drinking  Eat a diet that includes fresh fruits and vegetables, whole grains, lean protein, and low-fat dairy products. Take vitamin and mineral supplements as recommended by your health care provider. Do not drink alcohol if your health care provider tells you not to drink. If you drink alcohol: Limit how much you have to 0-2 drinks a day. Know how much alcohol is in your drink. In the [place] one drink equals one 12 oz bottle of beer (355 mL), one 5 oz glass of wine (148 mL), or one 1 oz glass of hard liquor (44 mL). Lifestyle Brush your teeth every morning and night with fluoride toothpaste. Floss one time each day. Exercise for at least 30 minutes 5 or more days each week. Do not use any products that contain nicotine or tobacco. These products include cigarettes, chewing tobacco, and vaping devices, such as e-cigarettes. If you need help quitting, ask your health care provider. Do not use drugs. If you are sexually active, practice safe sex. Use a condom or other form of protection to prevent STIs. Take aspirin only as told by your health care provider. Make sure that you understand how much to take and what form to take. Work with your health care provider to find out whether it is safe and beneficial for you to take aspirin daily. Find healthy ways to manage stress, such as: Meditation, yoga, or listening to music. Journaling. Talking to  a trusted person. Spending time with friends and family. Minimize exposure to UV radiation to reduce your risk of skin cancer. Safety Always wear your seat belt while  driving or riding in a vehicle. Do not drive: If you have been drinking alcohol. Do not ride with someone who has been drinking. When you are tired or distracted. While texting. If you have been using any mind-altering substances or drugs. Wear a helmet and other protective equipment during sports activities. If you have firearms in your house, make sure you follow all gun safety procedures. What's next? Go to your health care provider once a year for an annual wellness visit. Ask your health care provider how often you should have your eyes and teeth checked. Stay up to date on all vaccines. This information is not intended to replace advice given to you by your health care provider. Make sure you discuss any questions you have with your health care provider. Document Revised: 11/20/2020 Document Reviewed: 11/20/2020 Elsevier Patient Education  2024 [employer].

## 2023-10-04 NOTE — Progress Notes (Signed)
 Subjective:    Patient ID: Michael Howe, male    DOB: 1970-07-22, 53 y.o.   MRN: [id number]  Chief Complaint  Patient presents with  . Annual Exam    Patient presents today for a physical exam.  . Quality Metric Gaps    HIV screening and zoster    HPI Discussed the use of AI scribe software for clinical note transcription with the patient, who gave verbal consent to proceed.  History of Present Illness Michael Howe is a 54 year old male who presents for a routine follow-up visit.  He has been experiencing a lingering cough that began around the holidays or early January. He was tested for influenza and COVID-19, both of which were negative. His aunt suggested it might have been RSV, as she had similar symptoms. He experienced flu-like symptoms, including fever and cough, which had persisted for some time but have now resolved  He has a history of fatty liver, gout, and elevated cholesterol, which is managed with Lipitor. He monitors his diet closely to manage these conditions, particularly by watching carbohydrate intake. He recalls a previous urologist visit for kidney stones, which have not caused him issues recently. He had a PSA test a year ago and is due for another.  Family history includes his mother having diabetes. He is aware of his glucose levels being on the edge, with a previous reading of 100, and is considering an A1c test to monitor for diabetes.  He has a lesion on his right lower leg that has been present for a couple of years. He has a family history of skin cancer, with his grandparents having had it. He has seen a dermatologist in the past for precancerous lesions.    Past Medical History:  Diagnosis Date  . Allergic state 02/01/2012  . Allergy   . Cholelithiasis 03/10/2015  . Dyslipidemia 07/26/2013  . GERD (gastroesophageal reflux disease)    uses PRN OTC medications  . Gout   . Hyperlipidemia    on meds  . Obesity 06/30/2016  . Preventative  health care 07/14/2012  . Renal lithiasis 11/10/2015   hx of kidney stones  . Umbilical hernia 03/05/2015    Past Surgical History:  Procedure Laterality Date  . TONSILLECTOMY AND ADENOIDECTOMY  06/08/1973  . WISDOM TOOTH EXTRACTION      Family History  Problem Relation Age of Onset  . Hyperlipidemia Mother   . Diabetes Mother   . Bowel Disease Mother        IBS  . Alcohol abuse Father   . Heart disease Sister   . Gout Maternal Grandmother   . Heart disease Maternal Grandmother   . Cancer Maternal Grandmother   . Stroke Paternal Grandmother   . Autism Son   . Colon polyps Neg Hx   . Colon cancer Neg Hx   . Esophageal cancer Neg Hx   . Rectal cancer Neg Hx   . Stomach cancer Neg Hx     Social History   Socioeconomic History  . Marital status: Married    Spouse name: Not on file  . Number of children: Not on file  . Years of education: Not on file  . Highest education level: Not on file  Occupational History  . Not on file  Tobacco Use  . Smoking status: Never  . Smokeless tobacco: Never  Vaping Use  . Vaping status: Never Used  Substance and Sexual Activity  . Alcohol use: No  . Drug  use: No  . 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 Not on file  Social History Narrative  . Not on file   Social Drivers of Health   Financial Resource Strain: Not on file  Food Insecurity: Not on file  Transportation Needs: Not on file  Physical Activity: Not on file  Stress: Not on file  Social Connections: Not on file  Intimate Partner Violence: Not on file    Outpatient Medications Prior to Visit  Medication Sig Dispense Refill  . albuterol  (VENTOLIN  HFA) 108 (90 Base) MCG/ACT inhaler Inhale 2 puffs into the lungs every 6 (six) hours as needed for wheezing or shortness of breath. 18 g 1  . allopurinol  (ZYLOPRIM ) 300 MG tablet Take 1 tablet (300 mg total) by mouth daily. 90 tablet  3  . atorvastatin  (LIPITOR) 10 MG tablet Take 1 tablet (10 mg total) by mouth daily. 90 tablet 1  . colchicine  0.6 MG tablet 2 tabs po once then 1 tab po q 2 hours prn pain til pain gone, max of 6 tabs in 24 hours or intolerable diarrhea 30 tablet 1  . famotidine  (PEPCID ) 40 MG tablet Take 1 tablet (40 mg total) by mouth 2 (two) times daily. (Patient taking differently: Take 40 mg by mouth 2 (two) times daily as needed.) 60 tablet 1  . fluticasone  (FLONASE ) 50 MCG/ACT nasal spray Place 2 sprays into both nostrils daily as needed for allergies or rhinitis. (Patient taking differently: Place 2 sprays into both nostrils daily.) 16 g 2  . levocetirizine (XYZAL) 5 MG tablet Take 5 mg by mouth every evening.    Aaron Aas OVER THE COUNTER MEDICATION Liver MD supplement - daily    . tadalafil  (CIALIS ) 10 MG tablet Take 1 tablet (10 mg total) by mouth daily as needed for erectile dysfunction. 50 tablet 0   No facility-administered medications prior to visit.    Allergies  Allergen Reactions  . Pollen Extract     Review of Systems  Constitutional:  Negative for chills, fever and malaise/fatigue.  HENT:  Negative for congestion and hearing loss.   Eyes:  Negative for discharge.  Respiratory:  Negative for cough, sputum production and shortness of breath.   Cardiovascular:  Negative for chest pain, palpitations and leg swelling.  Gastrointestinal:  Negative for abdominal pain, blood in stool, constipation, diarrhea, heartburn, nausea and vomiting.  Genitourinary:  Negative for dysuria, frequency, hematuria and urgency.  Musculoskeletal:  Negative for back pain, falls and myalgias.  Skin:  Negative for rash.  Neurological:  Negative for dizziness, sensory change, loss of consciousness, weakness and headaches.  Endo/Heme/Allergies:  Negative for environmental allergies. Does not bruise/bleed easily.  Psychiatric/Behavioral:  Negative for depression and suicidal ideas. The patient is not nervous/anxious and  does not have insomnia.        Objective:    Physical Exam Vitals reviewed.  Constitutional:      General: He is not in acute distress.    Appearance: Normal appearance. He is not ill-appearing or diaphoretic.  HENT:     Head: Normocephalic and atraumatic.     Right Ear: Tympanic membrane, ear canal and external ear normal. There is no impacted cerumen.     Left Ear: Tympanic membrane, ear canal and external ear normal. There is no impacted cerumen.     Nose: Nose normal. No rhinorrhea.     Mouth/Throat:  Pharynx: Oropharynx is clear.  Eyes:     General: No scleral icterus.    Extraocular Movements: Extraocular movements intact.     Conjunctiva/sclera: Conjunctivae normal.     Pupils: Pupils are equal, round, and reactive to light.  Neck:     Thyroid : No thyroid  mass or thyroid  tenderness.  Cardiovascular:     Rate and Rhythm: Normal rate and regular rhythm.     Pulses: Normal pulses.     Heart sounds: Normal heart sounds. No murmur heard. Pulmonary:     Effort: Pulmonary effort is normal.     Breath sounds: Normal breath sounds. No wheezing.  Abdominal:     General: Bowel sounds are normal.     Palpations: Abdomen is soft. There is no mass.     Tenderness: There is no guarding.  Musculoskeletal:        General: No swelling. Normal range of motion.     Cervical back: Normal range of motion and neck supple. No rigidity.     Right lower leg: No edema.     Left lower leg: No edema.  Lymphadenopathy:     Cervical: No cervical adenopathy.  Skin:    General: Skin is warm and dry.     Findings: Lesion present. No rash.     Comments: 1 cm raised light brown lesion, round and smooth  Neurological:     General: No focal deficit present.     Mental Status: He is alert and oriented to person, place, and time.     Cranial Nerves: No cranial nerve deficit.     Deep Tendon Reflexes: Reflexes normal.  Psychiatric:        Mood and Affect: Mood normal.        Behavior: Behavior  normal.    BP 118/82   Pulse 78   Temp 97.6 F (36.4 C)   Resp 16   Ht 5\' 10"  (1.778 m)   Wt 242 lb 3.2 oz (109.9 kg)   SpO2 99%   BMI 34.75 kg/m  Wt Readings from Last 3 Encounters:  10/04/23 242 lb 3.2 oz (109.9 kg)  09/29/22 239 lb (108.4 kg)  11/06/21 220 lb (99.8 kg)    Diabetic Foot Exam - Simple   No data filed    Lab Results  Component Value Date   WBC 5.3 10/04/2023   HGB 15.5 10/04/2023   HCT 46.5 10/04/2023   PLT 256.0 10/04/2023   GLUCOSE 102 (H) 10/04/2023   CHOL 136 10/04/2023   TRIG 176.0 (H) 10/04/2023   HDL 35.40 (L) 10/04/2023   LDLDIRECT 105.0 06/30/2016   LDLCALC 66 10/04/2023   ALT 45 10/04/2023   AST 26 10/04/2023   NA 139 10/04/2023   K 4.2 10/04/2023   CL 103 10/04/2023   CREATININE 0.91 10/04/2023   BUN 11 10/04/2023   CO2 27 10/04/2023   TSH 1.48 10/04/2023   PSA 0.59 10/04/2023   HGBA1C 6.2 10/04/2023    Lab Results  Component Value Date   TSH 1.48 10/04/2023   Lab Results  Component Value Date   WBC 5.3 10/04/2023   HGB 15.5 10/04/2023   HCT 46.5 10/04/2023   MCV 89.9 10/04/2023   PLT 256.0 10/04/2023   Lab Results  Component Value Date   NA 139 10/04/2023   K 4.2 10/04/2023   CO2 27 10/04/2023   GLUCOSE 102 (H) 10/04/2023   BUN 11 10/04/2023   CREATININE 0.91 10/04/2023   BILITOT 0.7 10/04/2023  ALKPHOS 40 10/04/2023   AST 26 10/04/2023   ALT 45 10/04/2023   PROT 6.9 10/04/2023   ALBUMIN 4.5 10/04/2023   CALCIUM  9.5 10/04/2023   GFR 95.18 10/04/2023   Lab Results  Component Value Date   CHOL 136 10/04/2023   Lab Results  Component Value Date   HDL 35.40 (L) 10/04/2023   Lab Results  Component Value Date   LDLCALC 66 10/04/2023   Lab Results  Component Value Date   TRIG 176.0 (H) 10/04/2023   Lab Results  Component Value Date   CHOLHDL 4 10/04/2023   Lab Results  Component Value Date   HGBA1C 6.2 10/04/2023       Assessment & Plan:  Dyslipidemia Assessment & Plan: Encourage heart  healthy diet such as MIND or DASH diet, increase exercise, avoid trans fats, simple carbohydrates and processed foods, consider a krill or fish or flaxseed oil cap daily. Tolerating Atorvastatin    Erectile dysfunction, unspecified erectile dysfunction type Assessment & Plan: Cialis  prn   Obesity due to excess calories with serious comorbidity, unspecified class Assessment & Plan: Maintain heart healthy DASH or MIND diet, decrease po intake and increase exercise as tolerated. Needs 7-8 hours of sleep nightly. Avoid trans fats, eat small, frequent meals every 4-5 hours with lean proteins, complex carbs and healthy fats. Minimize simple carbs, high fat foods and processed foods   Preventative health care Assessment & Plan: Patient encouraged to maintain heart healthy diet, regular exercise, adequate sleep. Consider daily probiotics. Take medications as prescribed. Labs ordered and reviewed.  Colonoscopy 11/2021, repeat in 2026 Shingrix #1 2023  Orders: -     Comprehensive metabolic panel with GFR -     Lipid panel -     PSA -     TSH -     CBC with Differential/Platelet  Chronic gout without tophus, unspecified cause, unspecified site -     Uric acid  Hyperglycemia -     Hemoglobin A1c  Sun-damaged skin -     Ambulatory referral to Dermatology  Skin lesion of right leg -     Ambulatory referral to Dermatology  Need for shingles vaccine -     Varicella-zoster vaccine IM    Assessment and Plan Assessment & Plan Wellness Visit Routine wellness visit. Recent illness with lingering cough, likely RSV. Discussed RSV, pneumonia, and shingles vaccine eligibility and benefits. Emphasized advanced directives. - Administer second shingles vaccine today. - Order lab work including PSA, A1c, and other routine labs. - Provide information on advanced directives and encourage completion. - Schedule six-month virtual follow-up and twelve-month in-person physical.  Precancerous skin  lesions Precancerous lesion on right lower leg requires further evaluation. Emphasized regular dermatological evaluations due to family history of skin cancer. - Refer to dermatologist for evaluation of right lower leg lesion.  Hyperlipidemia Chronic hyperlipidemia managed with Lipitor. Discussed Lipitor's benefits in reducing Alzheimer's and dementia risk.  Hyperglycemia Glucose level at 100 suggests potential hyperglycemia. Discussed A1c test for prediabetes or diabetes evaluation due to family history. - Order A1c test.  Fatty liver Chronic fatty liver. Discussed management through weight loss, sugar reduction, and dietary modifications such as the Mediterranean or MIND diet.  Goals of Care Discussed advanced directives, including healthcare power of attorney and living will. Provided information to ensure medical decisions align with personal values and preferences. - Encourage completion of advanced directives and submission to the office for record keeping.     Randie Bustle, MD

## 2023-10-05 ENCOUNTER — Other Ambulatory Visit: Payer: Self-pay

## 2023-10-05 DIAGNOSIS — E785 Hyperlipidemia, unspecified: Secondary | ICD-10-CM

## 2023-10-05 DIAGNOSIS — M1A9XX0 Chronic gout, unspecified, without tophus (tophi): Secondary | ICD-10-CM

## 2023-10-05 DIAGNOSIS — R739 Hyperglycemia, unspecified: Secondary | ICD-10-CM

## 2023-10-05 DIAGNOSIS — N200 Calculus of kidney: Secondary | ICD-10-CM

## 2023-10-21 DIAGNOSIS — D2371 Other benign neoplasm of skin of right lower limb, including hip: Secondary | ICD-10-CM | POA: Diagnosis not present

## 2023-10-21 DIAGNOSIS — L570 Actinic keratosis: Secondary | ICD-10-CM | POA: Diagnosis not present

## 2023-10-23 ENCOUNTER — Other Ambulatory Visit: Payer: Self-pay | Admitting: Family Medicine

## 2023-12-13 ENCOUNTER — Encounter: Payer: Self-pay | Admitting: Family Medicine

## 2023-12-13 DIAGNOSIS — N529 Male erectile dysfunction, unspecified: Secondary | ICD-10-CM

## 2023-12-13 MED ORDER — TADALAFIL 10 MG PO TABS: 10.0000 mg | ORAL_TABLET | Freq: Every day | ORAL | 1 refills | Status: AC | PRN

## 2023-12-23 ENCOUNTER — Other Ambulatory Visit: Payer: Self-pay | Admitting: Family Medicine

## 2023-12-23 DIAGNOSIS — E785 Hyperlipidemia, unspecified: Secondary | ICD-10-CM

## 2024-03-27 ENCOUNTER — Ambulatory Visit: Payer: Self-pay | Admitting: Family Medicine

## 2024-03-27 ENCOUNTER — Other Ambulatory Visit

## 2024-03-27 DIAGNOSIS — N200 Calculus of kidney: Secondary | ICD-10-CM | POA: Diagnosis not present

## 2024-03-27 DIAGNOSIS — R739 Hyperglycemia, unspecified: Secondary | ICD-10-CM | POA: Diagnosis not present

## 2024-03-27 DIAGNOSIS — E785 Hyperlipidemia, unspecified: Secondary | ICD-10-CM | POA: Diagnosis not present

## 2024-03-27 DIAGNOSIS — M1A9XX0 Chronic gout, unspecified, without tophus (tophi): Secondary | ICD-10-CM | POA: Diagnosis not present

## 2024-03-27 LAB — TSH: TSH: 1.34 u[IU]/mL (ref 0.35–5.50)

## 2024-03-27 LAB — CBC WITH DIFFERENTIAL/PLATELET
Basophils Absolute: 0 K/uL (ref 0.0–0.1)
Basophils Relative: 1 % (ref 0.0–3.0)
Eosinophils Absolute: 0.1 K/uL (ref 0.0–0.7)
Eosinophils Relative: 1.7 % (ref 0.0–5.0)
HCT: 47.4 % (ref 39.0–52.0)
Hemoglobin: 15.7 g/dL (ref 13.0–17.0)
Lymphocytes Relative: 29 % (ref 12.0–46.0)
Lymphs Abs: 1.3 K/uL (ref 0.7–4.0)
MCHC: 33.1 g/dL (ref 30.0–36.0)
MCV: 87.9 fl (ref 78.0–100.0)
Monocytes Absolute: 0.6 K/uL (ref 0.1–1.0)
Monocytes Relative: 12.8 % — ABNORMAL HIGH (ref 3.0–12.0)
Neutro Abs: 2.6 K/uL (ref 1.4–7.7)
Neutrophils Relative %: 55.5 % (ref 43.0–77.0)
Platelets: 243 K/uL (ref 150.0–400.0)
RBC: 5.39 Mil/uL (ref 4.22–5.81)
RDW: 14.3 % (ref 11.5–15.5)
WBC: 4.6 K/uL (ref 4.0–10.5)

## 2024-03-27 LAB — URIC ACID: Uric Acid, Serum: 5.5 mg/dL (ref 4.0–7.8)

## 2024-03-27 LAB — COMPREHENSIVE METABOLIC PANEL WITH GFR
ALT: 47 U/L (ref 0–53)
AST: 29 U/L (ref 0–37)
Albumin: 4.6 g/dL (ref 3.5–5.2)
Alkaline Phosphatase: 42 U/L (ref 39–117)
BUN: 13 mg/dL (ref 6–23)
CO2: 27 meq/L (ref 19–32)
Calcium: 9.7 mg/dL (ref 8.4–10.5)
Chloride: 101 meq/L (ref 96–112)
Creatinine, Ser: 1 mg/dL (ref 0.40–1.50)
GFR: 84.71 mL/min (ref >60.00)
Glucose, Bld: 101 mg/dL — ABNORMAL HIGH (ref 70–99)
Potassium: 4.2 meq/L (ref 3.5–5.1)
Sodium: 138 meq/L (ref 135–145)
Total Bilirubin: 0.8 mg/dL (ref 0.2–1.2)
Total Protein: 7 g/dL (ref 6.0–8.3)

## 2024-03-27 LAB — HEMOGLOBIN A1C: Hgb A1c MFr Bld: 6.3 % (ref 4.6–6.5)

## 2024-03-27 LAB — LIPID PANEL
Cholesterol: 122 mg/dL (ref 0–200)
HDL: 34.1 mg/dL — ABNORMAL LOW (ref >39.00)
LDL Cholesterol: 58 mg/dL (ref 0–99)
NonHDL: 87.69
Total CHOL/HDL Ratio: 4
Triglycerides: 146 mg/dL (ref 0.0–149.0)
VLDL: 29.2 mg/dL (ref 0.0–40.0)

## 2024-04-04 ENCOUNTER — Telehealth: Admitting: Family Medicine

## 2024-07-03 ENCOUNTER — Other Ambulatory Visit: Payer: Self-pay | Admitting: Family Medicine

## 2024-07-03 DIAGNOSIS — E785 Hyperlipidemia, unspecified: Secondary | ICD-10-CM

## 2024-07-10 ENCOUNTER — Other Ambulatory Visit (HOSPITAL_COMMUNITY): Payer: Self-pay

## 2024-10-05 ENCOUNTER — Encounter: Admitting: Family Medicine
